# Patient Record
Sex: Male | Born: 1960 | Race: White | Hispanic: No | Marital: Married | Smoking: Former smoker
Health system: Southern US, Community
[De-identification: ages and names within clinical notes are randomized; demographics above are authoritative.]

## PROBLEM LIST (undated history)

## (undated) DIAGNOSIS — Z789 Other specified health status: Secondary | ICD-10-CM

## (undated) DIAGNOSIS — I471 Supraventricular tachycardia, unspecified: Secondary | ICD-10-CM

## (undated) HISTORY — DX: Supraventricular tachycardia, unspecified: I47.10

## (undated) HISTORY — DX: Supraventricular tachycardia: I47.1

## (undated) HISTORY — DX: Other specified health status: Z78.9

---

## 1991-06-13 HISTORY — PX: APPENDECTOMY: SHX54

## 2003-06-13 HISTORY — PX: HERNIA REPAIR: SHX51

## 2004-02-12 ENCOUNTER — Ambulatory Visit (HOSPITAL_COMMUNITY): Admission: RE | Admit: 2004-02-12 | Discharge: 2004-02-12 | Payer: Self-pay | Admitting: General Surgery

## 2004-02-12 ENCOUNTER — Emergency Department (HOSPITAL_COMMUNITY): Admission: EM | Admit: 2004-02-12 | Discharge: 2004-02-12 | Payer: Self-pay | Admitting: Emergency Medicine

## 2005-06-12 HISTORY — PX: VASECTOMY: SHX75

## 2008-07-02 ENCOUNTER — Encounter: Admission: RE | Admit: 2008-07-02 | Discharge: 2008-07-02 | Payer: Self-pay | Admitting: General Surgery

## 2009-12-02 IMAGING — CR DG CHEST 2V
2 series · 2 of 2 positions shown · non-contrast
Comparison: None

CLINICAL DATA: Preop prior to left inguinal hernia repair

CHEST - 2 VIEW

[w chest pa]
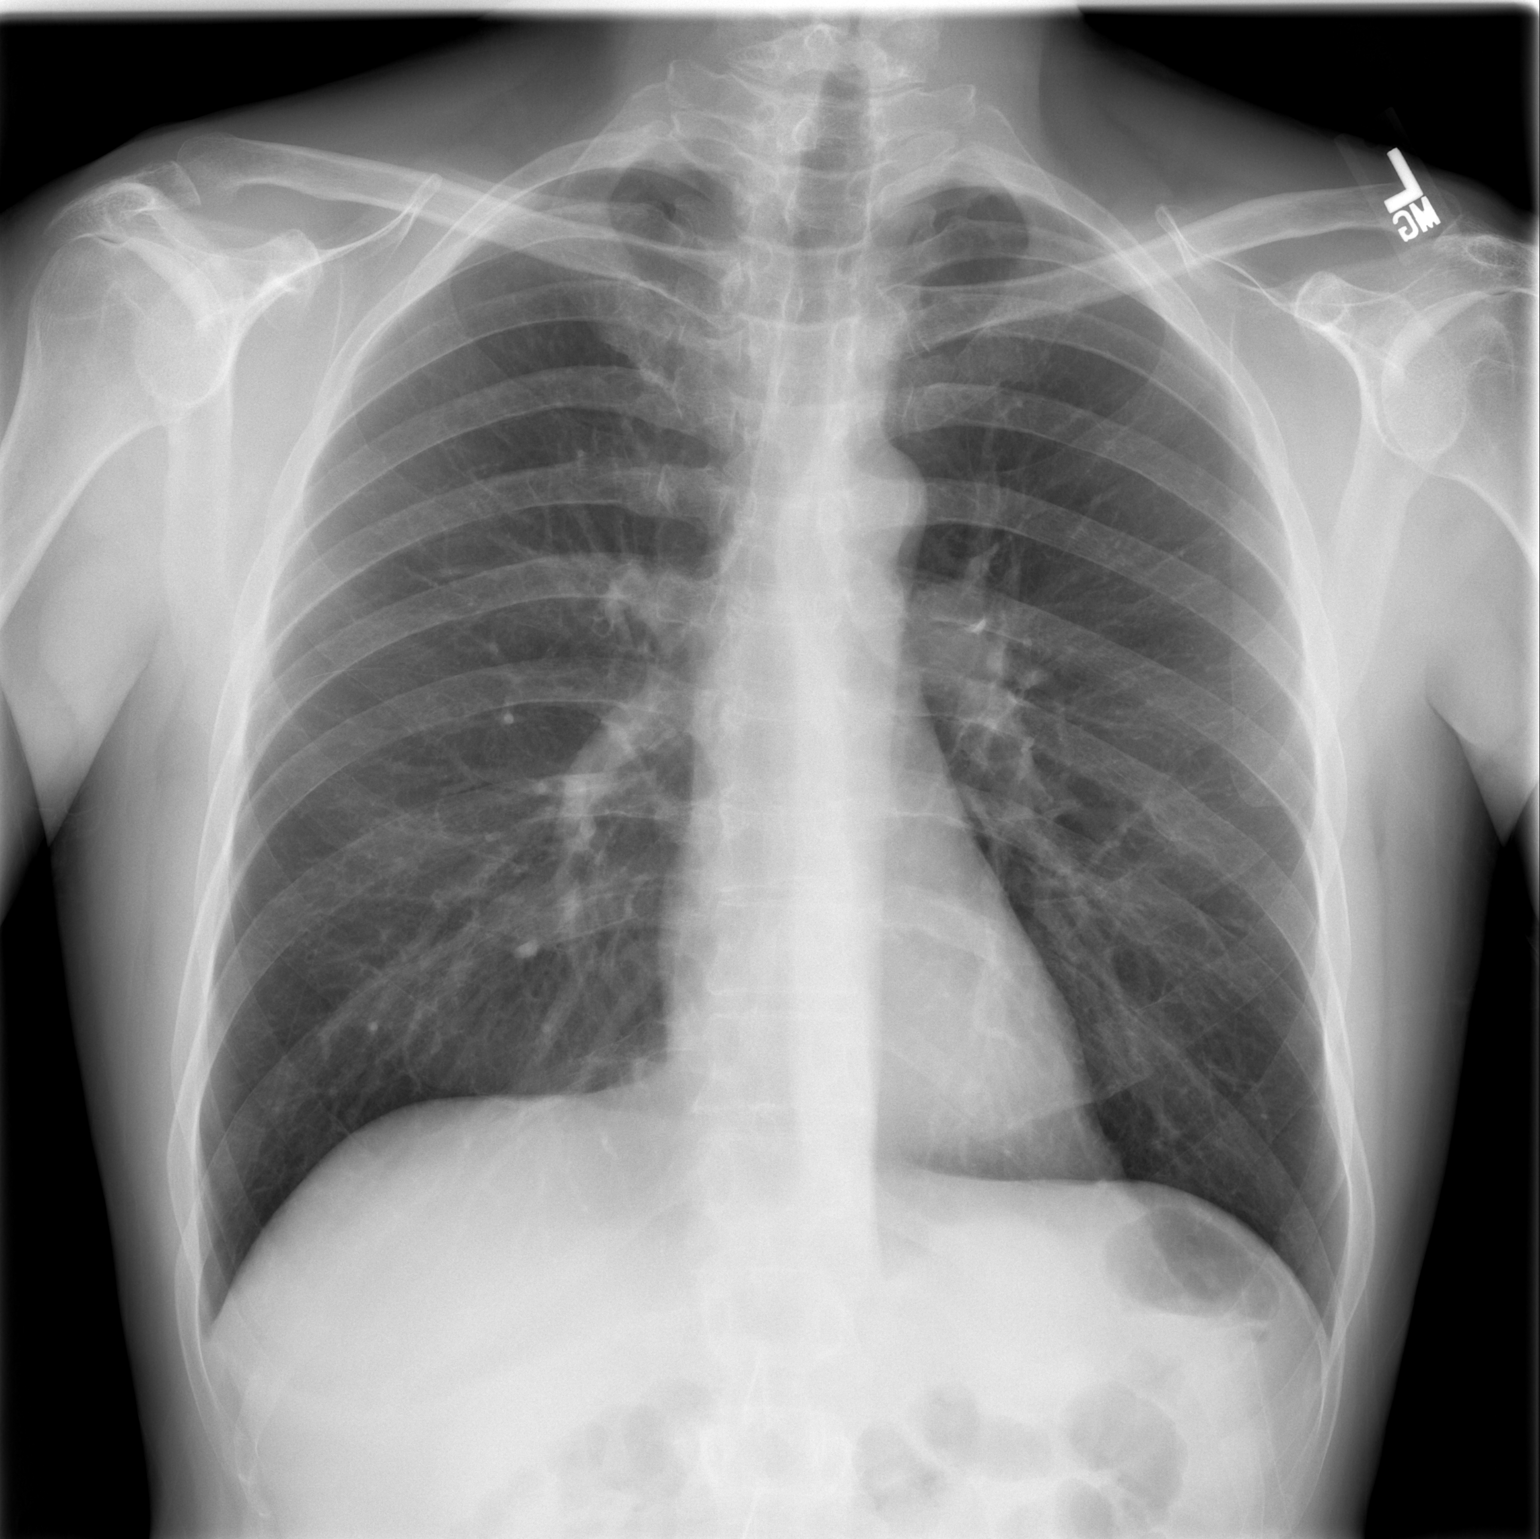

[w chest lat]
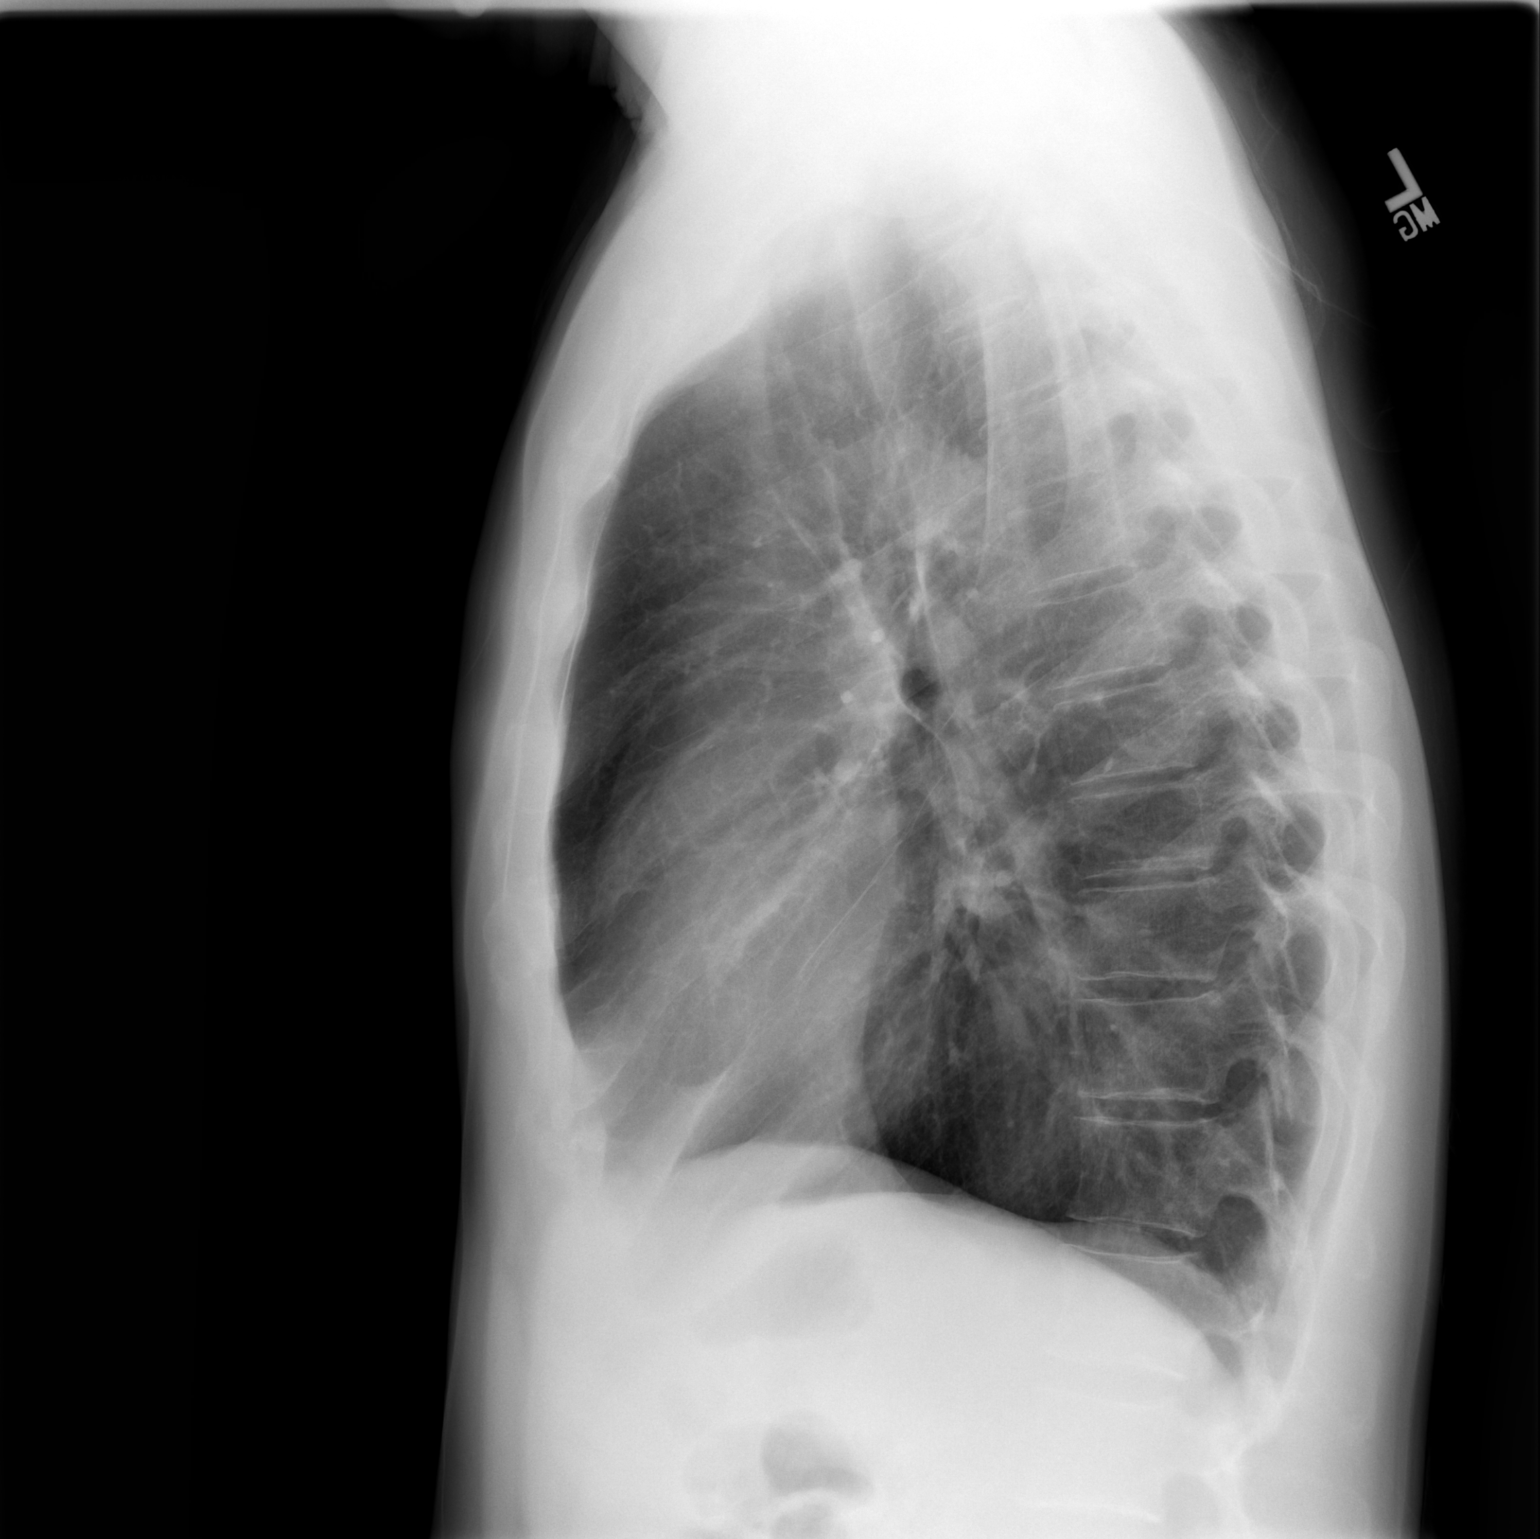

[2 of 2 positions shown; findings below may reference images not displayed]

FINDINGS: The lungs are clear. There is slight hyperaeration
present. The heart is within normal limits in size. No bony
abnormality is seen.
IMPRESSION: No active lung disease. Slight hyperaeration.

## 2010-10-28 NOTE — Op Note (Signed)
NAME:  ALEXIS, MIZUNO NO.:  [id number]   MEDICAL RECORD NO.:  [id number]                   PATIENT TYPE:  AMB   LOCATION:  DAY                                  FACILITY:  [facility]   PHYSICIAN:  Adolph Pollack, M.D.            DATE OF BIRTH:  04-Dec-1960   DATE OF PROCEDURE:  02/12/2004  DATE OF DISCHARGE:                                 OPERATIVE REPORT   PREOPERATIVE DIAGNOSIS:  Right inguinal hernia.   POSTOPERATIVE DIAGNOSIS:  Indirect right inguinal hernia.   PROCEDURE:  Laparoscopic repair of right inguinal hernia with mesh.   SURGEON:  Adolph Pollack, M.D.   ANESTHESIA:  General.   INDICATIONS:  Mr. Friday is a 50 year old male who felt a popping in the  right groin and then has noted a swelling.  When he strains, he says it  pops out.  He has to reduce it.  He has an obvious right inguinal hernia  on exam and has chosen a laparoscopic technique for repair.   TECHNIQUE:  He was seen in the holding area, then brought to the operating  room.  Placed supine on the operating room table.  A general anesthetic was  administered.  The abdominal wall hair was clipped.  The Foley catheter was  placed sterilely.  The abdominal wall was sterilely prepped and draped.   Dilute Marcaine solution was infiltrated at the subumbilical region, and a  subumbilical incision was made through the skin, and subcutaneous tissue to  the fascia was identified.  I then identified the right anterior rectus  sheath just off the midline and made a small incision in it.  The underlying  rectus muscle was then dissected laterally, exposing the posterior rectus  sheath.  A balloon dissection device was placed into the extraperitoneal  space, and balloon dissection was performed.  When adequate balloon  dissection was insured, the balloon was removed.  A trocar was placed in the  extraperitoneal space, and CO2 gas was used to insufflate the  extraperitoneal space.  The  laparoscope was reintroduced, and under direct  vision, two 5 mm trocars were placed just to the left of the lower midline.  Using blunt dissection, the symphysis pubis and Cooper's ligament were  exposed on the right side.  The direct space appeared solid.  I then used  blunt dissection to dissect the fibrofatty tissue away from the lateral and  anterior abdominal wall up to the level of the umbilicus on the right side.  First, spermatic cord was isolated using blunt dissection.  I then saw an  indirect hernia sac, which was fairly adherent to the spermatic cord and  with blunt dissection, I was able to reduce this and separate it from the  cord.  A small hole was made into the peritoneum, but I did not feel I  needed to close this.  I was able to strip  the sac back to the level of the  umbilicus.   A piece of 5x6 polypropylene mesh with a partial longitudinal slit cut into  it was then brought onto the field and placed appropriately in the right  extraperitoneal space with the two tails wrapped around the cord.  The mesh  was then anchored to Cooper's ligament at the anterior and lateral abdominal  walls with spiral tacks.  This provided for adequate coverage in direct,  indirect, and femoral spaces.  The two tails were crossed, creating a new  internal ring.   The area was examined, and hemostasis was adequate.  The inferior lateral  aspects of the mesh were then held down, and the CO2 gases were released.  I  watched the extraperitoneal contents approximate the mesh.  I then removed  all of the trocars.  The right anterior rectus sheath defect was closed with  interrupted 0 Vicryl sutures and  the skin incisions were closed with 4-0 Monocryl subcuticular stitches  followed by Steri-Strips and sterile dressings.  He tolerated the procedure  well without any apparent complications and was taken to the recovery room  in satisfactory condition.                                                Adolph Pollack, M.D.    Kari Baars  D:  02/12/2004  T:  02/12/2004  Job:  [id number]   cc:   Thora Lance, M.D.  [address]  [place]  [place] [address]  Fax: [phone]

## 2017-12-27 ENCOUNTER — Ambulatory Visit: Payer: Self-pay | Admitting: Family Medicine

## 2017-12-27 ENCOUNTER — Encounter: Payer: Self-pay | Admitting: Family Medicine

## 2017-12-28 ENCOUNTER — Ambulatory Visit (INDEPENDENT_AMBULATORY_CARE_PROVIDER_SITE_OTHER): Payer: 59 | Admitting: Family Medicine

## 2017-12-28 ENCOUNTER — Encounter: Payer: Self-pay | Admitting: Family Medicine

## 2017-12-28 VITALS — BP 120/74 | HR 68 | Temp 98.4°F | Ht 72.0 in | Wt 155.4 lb

## 2017-12-28 DIAGNOSIS — Z0000 Encounter for general adult medical examination without abnormal findings: Secondary | ICD-10-CM

## 2017-12-28 DIAGNOSIS — I8390 Asymptomatic varicose veins of unspecified lower extremity: Secondary | ICD-10-CM | POA: Diagnosis not present

## 2017-12-28 DIAGNOSIS — I499 Cardiac arrhythmia, unspecified: Secondary | ICD-10-CM | POA: Diagnosis not present

## 2017-12-28 DIAGNOSIS — R0982 Postnasal drip: Secondary | ICD-10-CM

## 2017-12-28 DIAGNOSIS — Z23 Encounter for immunization: Secondary | ICD-10-CM | POA: Diagnosis not present

## 2017-12-28 LAB — LIPID PANEL
CHOLESTEROL: 234 mg/dL — AB (ref 0–200)
HDL: 60 mg/dL (ref >39.00)
LDL Cholesterol: 158 mg/dL — ABNORMAL HIGH (ref 0–99)
NONHDL: 174.23
Total CHOL/HDL Ratio: 4
Triglycerides: 81 mg/dL (ref 0.0–149.0)
VLDL: 16.2 mg/dL (ref 0.0–40.0)

## 2017-12-28 LAB — COMPREHENSIVE METABOLIC PANEL
ALBUMIN: 4.3 g/dL (ref 3.5–5.2)
ALK PHOS: 68 U/L (ref 39–117)
ALT: 20 U/L (ref 0–53)
AST: 18 U/L (ref 0–37)
BILIRUBIN TOTAL: 0.6 mg/dL (ref 0.2–1.2)
BUN: 19 mg/dL (ref 6–23)
CO2: 30 mEq/L (ref 19–32)
CREATININE: 1.09 mg/dL (ref 0.40–1.50)
Calcium: 9.6 mg/dL (ref 8.4–10.5)
Chloride: 103 mEq/L (ref 96–112)
GFR: 73.62 mL/min (ref >60.00)
GLUCOSE: 109 mg/dL — AB (ref 70–99)
Potassium: 5 mEq/L (ref 3.5–5.1)
SODIUM: 138 meq/L (ref 135–145)
TOTAL PROTEIN: 7.2 g/dL (ref 6.0–8.3)

## 2017-12-28 NOTE — Patient Instructions (Addendum)
Give us 2-3 business days to get the results of your labs back. If labs are normal, you will likely receive a letter in the mail unless you have MyChart. This can take longer than 2-3 business days.   Stay active and keep the diet clean.  I don't think I would do anything with the shortness of breath/irregular pulse. If that changes, let me know.   Let me know if you would like to see a surgeon for your varicose veins.   Allegra (fexofenadine) or Zyrtec (cetirizine); Generic, and therefore cheaper, options are in the parentheses.   Flonase (fluticasone); nasal spray that is over the counter. 2 sprays each nostril, once daily. Aim towards the same side eye when you spray.  There are available OTC, and the generic versions, which may be cheaper, are in parentheses. Show this to a pharmacist if you have trouble finding any of these items.  Let us know if you need anything.

## 2017-12-28 NOTE — Progress Notes (Signed)
Pre visit review using our clinic review tool, if applicable. No additional management support is needed unless otherwise documented below in the visit note. 

## 2017-12-28 NOTE — Progress Notes (Signed)
Chief Complaint  Patient presents with  . New Patient (Initial Visit)    Well Male Darryl Curry is here for a complete physical.   His last physical was >1 year ago.  Current diet: in general, a "healthy" diet.  Current exercise: walking Weight trend: stable Does pt snore? No. Daytime fatigue? No. Seat belt? Yes.    Health maintenance Shingrix- No Colonoscopy- Yes - 6-7 years ago Tetanus- No HIV- Yes - 12/28/1988 Hep C- Yes - 12/28/1988 Prostate cancer screening- No   Patient has a history of postnasal drip that affects him most in the morning.  He is having some congestion.  He takes Zyrtec daily.  Denies any fevers, wheezing, or shortness of breath associated with these episodes.  He will have intermittent periods of shortness of breath.  He will feel his pulse and it will feel irregular.  It lasts around 30 seconds.  This happens around 3 times per year.  No chest pain.  He also has a history of varicose veins.  They are not painful.  He is wondering if there is anything that can be done for them.  He used to stand a lot while at work.  He believes this contributed to the development.   History reviewed. No pertinent past medical history.    Past Surgical History:  Procedure Laterality Date  . APPENDECTOMY  1993  . HERNIA REPAIR  2005   right 2005 and left 2008  . VASECTOMY  2007    Medications  Takes no meds routinely.   Allergies No Known Allergies  Family History Family History  Problem Relation Age of Onset  . Heart disease Mother   . Cancer Sister     Review of Systems: Constitutional:  no fevers Eye:  no recent significant change in vision Ear/Nose/Mouth/Throat:  Ears:  no hearing loss Nose/Mouth/Throat:  +nasal congestion, no sore throat Cardiovascular:  no chest pain Respiratory:  no cough and no current shortness of breath Gastrointestinal:  no abdominal pain, no change in bowel habits GU:  Male: negative for dysuria, frequency, and incontinence  and negative for prostate symptoms Musculoskeletal/Extremities:  no pain, redness, or swelling of the joints Integumentary (Skin/Breast): +varicose veins, otherwise no abnormal skin lesions reported Neurologic:  no headaches Endocrine: No unexpected weight changes Hematologic/Lymphatic:  no abnormal bleeding  Exam BP 120/74 (BP Location: Left Arm, Patient Position: Sitting, Cuff Size: Normal)   Pulse 68   Temp 98.4 F (36.9 C) (Oral)   Ht 6' (1.829 m)   Wt 155 lb 6 oz (70.5 kg)   SpO2 97%   BMI 21.07 kg/m  General:  well developed, well nourished, in no apparent distress Skin:  no significant moles, warts, or growths Head:  no masses, lesions, or tenderness Eyes:  pupils equal and round, sclera anicteric without injection Ears:  canals without lesions, TMs shiny without retraction, no obvious effusion, no erythema Nose:  nares patent, septum midline, mucosa normal Throat/Pharynx:  lips and gingiva without lesion; tongue and uvula midline; non-inflamed pharynx; no exudates or postnasal drainage Neck: neck supple without adenopathy, thyromegaly, or masses Lungs:  clear to auscultation, breath sounds equal bilaterally, no respiratory distress Cardio:  regular rate and rhythm, no LE edema, no bruits Abdomen:  abdomen soft, nontender; bowel sounds normal; no masses or organomegaly Genital (male): Nml penis, no lesions or discharge; testes present bilaterally without masses or tenderness Rectal: Deferred Musculoskeletal:  symmetrical muscle groups noted without atrophy or deformity Extremities: +varicose veins; otherwise no clubbing,  cyanosis, or edema, no deformities, no skin discoloration Neuro:  gait normal; deep tendon reflexes normal and symmetric Psych: well oriented with normal range of affect and appropriate judgment/insight  Assessment and Plan  Well adult exam - Plan: Lipid panel, Comprehensive metabolic panel  PND (post-nasal drip)  Irregular heartbeat  Asymptomatic  varicose veins of lower extremity, unspecified laterality  Need for tetanus booster - Plan: Tdap vaccine greater than or equal to 7yo IM   Well 57 y.o. male. Counseled on diet and exercise. Counseled on risks and benefits of prostate cancer screening with PSA. The patient agrees to forego testing. Recommended adding an intranasal corticosteroid.  Could continue Zyrtec on a daily basis or change to Allegra.  If no improvement, could consider montelukast versus referral. Approximately 90 seconds of an irregular heartbeat yearly is not significant enough to treat even if it were atrial fibrillation.  We will monitor and if anything changes, he will let us know. I did offer referral to a vascular surgeon, however he would prefer to wait at this time given his issues are not symptomatic. Immunizations, labs, and further orders as above. Follow up 1 yr or prn. The patient voiced understanding and agreement to the plan.  Jilda Rocheicholas Paul PalmyraWendling, DO 12/28/17 12:09 PM

## 2017-12-31 ENCOUNTER — Other Ambulatory Visit: Payer: Self-pay | Admitting: Family Medicine

## 2017-12-31 DIAGNOSIS — E782 Mixed hyperlipidemia: Secondary | ICD-10-CM

## 2018-01-03 ENCOUNTER — Encounter: Payer: Self-pay | Admitting: Podiatry

## 2018-01-03 ENCOUNTER — Ambulatory Visit (INDEPENDENT_AMBULATORY_CARE_PROVIDER_SITE_OTHER): Payer: 59 | Admitting: Podiatry

## 2018-01-03 DIAGNOSIS — B070 Plantar wart: Secondary | ICD-10-CM | POA: Diagnosis not present

## 2018-01-03 NOTE — Progress Notes (Signed)
  Subjective:  Patient ID: Margurite Auerbacholan I Husband, male    DOB: Nov 13, 1960,  MRN: [id number] HPI Chief Complaint  Patient presents with  . Plantar Warts    Patient presents today for plantar wart bottom of right forefoot x 4 months.  He reports it was sore, but now feels better.  He used otc Dr. Jari SportsmanScholls wart remover which didn't help.    57 y.o. male presents with the above complaint.   Denies fever chills nausea vomiting muscle aches pains calf pain back pain chest pain shortness of breath.  Past Medical History:  Diagnosis Date  . No known health problems    Past Surgical History:  Procedure Laterality Date  . APPENDECTOMY  1993  . HERNIA REPAIR  2005   right 2005 and left 2008  . VASECTOMY  2007    Current Outpatient Medications:  .  cetirizine (ZYRTEC) 10 MG tablet, Take 10 mg by mouth daily., Disp: , Rfl:   No Known Allergies Review of Systems Objective:  There were no vitals filed for this visit.  General: Well developed, nourished, in no acute distress, alert and oriented x3   Dermatological: Skin is warm, dry and supple bilateral. Nails x 10 are well maintained; remaining integument appears unremarkable at this time. There are no open sores, no preulcerative lesions, no rash or signs of infection present.  Small what appears to be verrucoid lesion to the plantar aspect of the forefoot right.  Was debrided demonstrates no lesion whatsoever.  Vascular: Dorsalis Pedis artery and Posterior Tibial artery pedal pulses are 2/4 bilateral with immedate capillary fill time. Pedal hair growth present. No varicosities and no lower extremity edema present bilateral.   Neruologic: Grossly intact via light touch bilateral. Vibratory intact via tuning fork bilateral. Protective threshold with Semmes Wienstein monofilament intact to all pedal sites bilateral. Patellar and Achilles deep tendon reflexes 2+ bilateral. No Babinski or clonus noted bilateral.   Musculoskeletal: No gross boney pedal  deformities bilateral. No pain, crepitus, or limitation noted with foot and ankle range of motion bilateral. Muscular strength 5/5 in all groups tested bilateral.  Gait: Unassisted, Nonantalgic.    Radiographs:  None taken  Assessment & Plan:   Assessment: Resolution of wart  Plan: Debrided the area today follow-up with me with any recurrence.     Max T. CarolinaHyatt, North DakotaDPM

## 2018-01-14 ENCOUNTER — Emergency Department (HOSPITAL_BASED_OUTPATIENT_CLINIC_OR_DEPARTMENT_OTHER): Payer: 59

## 2018-01-14 ENCOUNTER — Encounter (HOSPITAL_BASED_OUTPATIENT_CLINIC_OR_DEPARTMENT_OTHER): Payer: Self-pay | Admitting: *Deleted

## 2018-01-14 ENCOUNTER — Other Ambulatory Visit: Payer: Self-pay

## 2018-01-14 ENCOUNTER — Ambulatory Visit: Payer: 59

## 2018-01-14 ENCOUNTER — Emergency Department (HOSPITAL_BASED_OUTPATIENT_CLINIC_OR_DEPARTMENT_OTHER)
Admission: EM | Admit: 2018-01-14 | Discharge: 2018-01-14 | Disposition: A | Discharge status: Home / Self Care | Payer: 59 | Attending: Emergency Medicine | Admitting: Emergency Medicine

## 2018-01-14 VITALS — BP 118/90 | HR 102 | Temp 98.0°F | Resp 18

## 2018-01-14 DIAGNOSIS — I499 Cardiac arrhythmia, unspecified: Secondary | ICD-10-CM

## 2018-01-14 DIAGNOSIS — Z87891 Personal history of nicotine dependence: Secondary | ICD-10-CM | POA: Insufficient documentation

## 2018-01-14 DIAGNOSIS — R002 Palpitations: Secondary | ICD-10-CM | POA: Insufficient documentation

## 2018-01-14 DIAGNOSIS — R0602 Shortness of breath: Secondary | ICD-10-CM | POA: Diagnosis not present

## 2018-01-14 LAB — BASIC METABOLIC PANEL
Anion gap: 8 (ref 5–15)
BUN: 10 mg/dL (ref 6–20)
CHLORIDE: 103 mmol/L (ref 98–111)
CO2: 27 mmol/L (ref 22–32)
Calcium: 9.2 mg/dL (ref 8.9–10.3)
Creatinine, Ser: 1.04 mg/dL (ref 0.61–1.24)
GFR calc Af Amer: >60 mL/min (ref >60)
GLUCOSE: 141 mg/dL — AB (ref 70–99)
POTASSIUM: 4.2 mmol/L (ref 3.5–5.1)
Sodium: 138 mmol/L (ref 135–145)

## 2018-01-14 LAB — CBC
HEMATOCRIT: 41.3 % (ref 39.0–52.0)
Hemoglobin: 14.6 g/dL (ref 13.0–17.0)
MCH: 32.8 pg (ref 26.0–34.0)
MCHC: 35.4 g/dL (ref 30.0–36.0)
MCV: 92.8 fL (ref 78.0–100.0)
Platelets: 240 10*3/uL (ref 150–400)
RBC: 4.45 MIL/uL (ref 4.22–5.81)
RDW: 11.9 % (ref 11.5–15.5)
WBC: 9.6 10*3/uL (ref 4.0–10.5)

## 2018-01-14 LAB — TROPONIN I: Troponin I: <0.03 ng/mL (ref <0.03)

## 2018-01-14 NOTE — ED Notes (Signed)
Pt verbalizes understanding of d/c instructions and denies any further need at this time. 

## 2018-01-14 NOTE — ED Provider Notes (Addendum)
[facility] EMERGENCY DEPARTMENT Provider Note   CSN: [id number] Arrival date & time: 01/14/18  1642     History   Chief Complaint Chief Complaint  Patient presents with  . Palpitations    HPI Darryl Curry is a 57 y.o. male presenting today for heart palpitations.  Patient states that at 4 PM today he began having palpitations, feeling like his heart was skipping a beat followed by some shortness of breath.  Patient states that this continued for approximately 45 minutes and stopped when he got to his primary care provider.  Patient states that his primary care provider then sent him here to the emergency department.  Patient denying palpitations or shortness of breath upon arrival to the ED. Patient states that he has these palpitations every 4-6 months and they last normally 5 minutes.  He states that he became concerned because the episode of palpitations lasted longer today.  Patient states that he is otherwise healthy, denies history of heart disease, clots, dysrhythmias.  Patient denies chest pain, nausea vomiting, diarrhea, abdominal pain, headache, syncope or vision changes.  HPI  Past Medical History:  Diagnosis Date  . No known health problems     There are no active problems to display for this patient.   Past Surgical History:  Procedure Laterality Date  . APPENDECTOMY  1993  . HERNIA REPAIR  2005   right 2005 and left 2008  . VASECTOMY  2007        Home Medications    Prior to Admission medications   Medication Sig Start Date End Date Taking? Authorizing Provider  cetirizine (ZYRTEC) 10 MG tablet Take 10 mg by mouth daily.    [provider]  Ibuprofen 200 MG CAPS Take by mouth.    [provider]    Family History Family History  Problem Relation Age of Onset  . Heart disease Mother   . Heart attack Mother   . Cancer Sister     Social History Social History   Tobacco Use  . Smoking status: Former Games developer  .  Smokeless tobacco: Never Used  Substance Use Topics  . Alcohol use: Yes    Frequency: Never  . Drug use: Never     Allergies   Patient has no known allergies.   Review of Systems Review of Systems  Constitutional: Negative.  Negative for chills and fever.  HENT: Negative.  Negative for rhinorrhea and sore throat.   Eyes: Negative.  Negative for visual disturbance.  Respiratory: Positive for shortness of breath. Negative for cough.   Cardiovascular: Positive for palpitations. Negative for chest pain and leg swelling.  Gastrointestinal: Negative.  Negative for abdominal pain, blood in stool, diarrhea, nausea and vomiting.  Genitourinary: Negative.  Negative for dysuria and hematuria.  Musculoskeletal: Negative.  Negative for arthralgias and myalgias.  Skin: Negative.  Negative for rash.  Neurological: Negative.  Negative for dizziness, syncope, weakness, light-headedness, numbness and headaches.     Physical Exam Updated Vital Signs BP (!) 142/99   Pulse 86   Resp 17   Ht 6' (1.829 m)   Wt 70.3 kg (155 lb)   SpO2 99%   BMI 21.02 kg/m   Physical Exam  Constitutional: He is oriented to person, place, and time. He appears well-developed and well-nourished. No distress.  HENT:  Head: Normocephalic and atraumatic.  Right Ear: External ear normal.  Left Ear: External ear normal.  Nose: Nose normal.  Eyes: Pupils are equal, round, and reactive  to light. EOM are normal.  Neck: Trachea normal and normal range of motion. No tracheal deviation present.  Cardiovascular: Normal rate, regular rhythm, normal heart sounds and intact distal pulses.  No murmur heard. Pulses:      Dorsalis pedis pulses are 3+ on the right side, and 3+ on the left side.       Posterior tibial pulses are 3+ on the right side, and 3+ on the left side.  Pulmonary/Chest: Effort normal and breath sounds normal. No respiratory distress. He has no wheezes.  Abdominal: Soft. There is no tenderness. There is no  rebound and no guarding.  Musculoskeletal: Normal range of motion.       Right ankle: Normal.       Left ankle: Normal.       Right lower leg: Normal.       Left lower leg: Normal.       Right foot: Normal.       Left foot: Normal.  Neurological: He is alert and oriented to person, place, and time.  Skin: Skin is warm and dry. Capillary refill takes less than 2 seconds.  Psychiatric: He has a normal mood and affect. His behavior is normal.    ED Treatments / Results  Labs (all labs ordered are listed, but only abnormal results are displayed) Labs Reviewed  BASIC METABOLIC PANEL - Abnormal; Notable for the following components:      Result Value   Glucose, Bld 141 (*)    All other components within normal limits  CBC  TROPONIN I    EKG EKG Interpretation  Date/Time:  Monday January 14 2018 16:54:04 EDT Ventricular Rate:  82 PR Interval:    QRS Duration: 99 QT Interval:  367 QTC Calculation: 429 R Axis:   81 Text Interpretation:  Normal sinus rhythm no acute ST/T changes No old tracing to compare Confirmed by Pricilla Loveless [phone]) on 01/14/2018 5:11:15 PM   Radiology Dg Chest 2 View  Result Date: 01/14/2018 CLINICAL DATA:  Palpitation and shortness of breath for 1 hour EXAM: CHEST - 2 VIEW COMPARISON:  07/02/2008 FINDINGS: Artifact from EKG leads. There is no edema, consolidation, effusion, or pneumothorax. Normal heart size and mediastinal contours. IMPRESSION: Negative chest. Electronically Signed   By: Marnee Spring M.D.   On: 01/14/2018 17:09    Procedures Procedures (including critical care time)  Medications Ordered in ED Medications - No data to display   Initial Impression / Assessment and Plan / ED Course  I have reviewed the triage vital signs and the nursing notes.  Pertinent labs & imaging results that were available during my care of the patient were reviewed by me and considered in my medical decision making (see chart for details).    Patient  presenting for palpitations that lasted approximately 45 minutes today.  Patient with history of similar palpitations in the past however he states that they have never lasted this long before.  EKG shows no acute abnormality at this time, chest x-ray negative, troponin negative, CBC and BMP nonacute.  Patient was placed on cardiac monitor here in the emergency department and had no recurrence of his palpitations today.  Patient denying palpitations, shortness of breath or chest pain in the emergency department.  Patient has reliable follow-up with his primary care provider.  Patient offered referral to cardiology however he refused stating he would rather follow-up with his primary care provider and then possibly follow-up with cardiologist in the future.  Patient was also seen  and evaluated by Dr. Hedwig MortonGoldson who agrees that patient is safe for discharge with PCP follow-up at this time.  At this time there does not appear to be any evidence of an acute emergency medical condition and the patient appears stable for discharge with appropriate outpatient follow up. Diagnosis was discussed with patient who verbalizes understanding of care plan and is agreeable to discharge. I have discussed return precautions with patient who verbalizes understanding of return precautions. Patient strongly encouraged to follow-up with their PCP. All questions answered.  Patient's case discussed with Dr. Criss AlvineGoldston who agrees with plan to discharge with follow-up.     Note: Portions of this report may have been transcribed using voice recognition software. Every effort was made to ensure accuracy; however, inadvertent computerized transcription errors may still be present.   Final Clinical Impressions(s) / ED Diagnoses   Final diagnoses:  Palpitations    ED Discharge Orders    None       Bill SalinasMorelli, Chloe Flis A, PA-C 01/14/18 1850    Elizabeth PalauMorelli, Shakenya Stoneberg A, PA-C 01/14/18 1851    Pricilla LovelessGoldston, Scott, MD 01/15/18 1450

## 2018-01-14 NOTE — ED Notes (Signed)
Pt c/o intermittent palpitations for the last several years, today they lasted about 20 minutes accompanied by SOB.  Pt denies current SOB, denies current palpitations, denies chest pain.

## 2018-01-14 NOTE — Progress Notes (Signed)
Pt walked into clinic, c/o heart palpitations, SOB X . Pt. Denies any known trigger/change in activity/diet. During triage, pt. stated sx had resolved, VSS. Mother has cardiac history, but no known medical hx on pt. Medications and allergies reviewed. Pt. Was unable to be seen by provider today,so author sent to ED to be further evaluated before his trip out of town. Report to be given to charge RN.

## 2018-01-14 NOTE — ED Triage Notes (Signed)
Pt sent here by PMD for eval , palpitations x 45 mins

## 2018-01-14 NOTE — Discharge Instructions (Addendum)
Please follow-up with your primary care provider as soon as you can regarding your visit today.  It is likely that they will set you up to follow-up with a cardiologist in the future for further evaluation. Please return to the emergency department for any new or worsening symptoms or if your palpitations return. Your blood pressure was also elevated today, please be sure to follow-up with your primary care provider regarding this as well.  Contact a health care provider if: You continue to have a fast or irregular heartbeat after 24 hours. Your palpitations occur more often. Get help right away if: You have chest pain or shortness of breath. You have a severe headache. You feel dizzy or you faint.

## 2018-01-18 ENCOUNTER — Ambulatory Visit (INDEPENDENT_AMBULATORY_CARE_PROVIDER_SITE_OTHER): Payer: 59 | Admitting: Family Medicine

## 2018-01-18 ENCOUNTER — Encounter: Payer: Self-pay | Admitting: Family Medicine

## 2018-01-18 VITALS — BP 108/80 | HR 75 | Temp 98.2°F | Ht 72.0 in | Wt 156.0 lb

## 2018-01-18 DIAGNOSIS — G5601 Carpal tunnel syndrome, right upper limb: Secondary | ICD-10-CM

## 2018-01-18 DIAGNOSIS — R002 Palpitations: Secondary | ICD-10-CM

## 2018-01-18 DIAGNOSIS — S46811A Strain of other muscles, fascia and tendons at shoulder and upper arm level, right arm, initial encounter: Secondary | ICD-10-CM | POA: Diagnosis not present

## 2018-01-18 HISTORY — DX: Palpitations: R00.2

## 2018-01-18 HISTORY — DX: Carpal tunnel syndrome, right upper limb: G56.01

## 2018-01-18 NOTE — Progress Notes (Signed)
Pre visit review using our clinic review tool, if applicable. No additional management support is needed unless otherwise documented below in the visit note. 

## 2018-01-18 NOTE — Progress Notes (Signed)
Chief Complaint  Patient presents with  . Follow-up    ER--heart palpitations    Darryl Curry is a 57 y.o. male here for palpitations.  Length of issue: chronic, 1.5 weeks ago How long does it last: 5-45 minutes Light headedness/passing out? No Shortness of breath? Yes  CP? No Hx of arrhythmia? No Medication changes/illicit substances? No   He has been having right shoulder pain over the past several weeks.  It is steadily improving.  He is wondering if is related to numbness/tingling in his right hand.  That seems constant.  He is right-handed.  There is no injury or change in activity.  He has been using anti-inflammatories at home with some relief.  He is not dropping things.  Denies weakness otherwise.  ROS: Cardiac: As noted in HPI Pulm: No SOB  Past Medical History:  Diagnosis Date  . No known health problems    Past Surgical History:  Procedure Laterality Date  . APPENDECTOMY  1993  . HERNIA REPAIR  2005   right 2005 and left 2008  . VASECTOMY  2007   No Known Allergies Allergies as of 01/18/2018   No Known Allergies     Medication List        Accurate as of 01/18/18  3:36 PM. Always use your most recent med list.          cetirizine 10 MG tablet Commonly known as:  ZYRTEC Take 10 mg by mouth daily.   Ibuprofen 200 MG Caps Take by mouth.       BP 108/80 (BP Location: Left Arm, Patient Position: Sitting, Cuff Size: Normal)   Pulse 75   Temp 98.2 F (36.8 C) (Oral)   Ht 6' (1.829 m)   Wt 156 lb (70.8 kg)   SpO2 95%   BMI 21.16 kg/m  Gen: Awake, alert, appearing stated age Eyes: PERRLA Mouth: MMM Heart: RRR, no bruits, no LE edema Lungs: CTAB, no accessory muscle use Neuro: No cerebellar signs; + Phalen's, negative Tinel's on the right MSK: No muscle group atrophy or asymmetry, 5/5 strength throughout in the upper extremities bilaterally, mild tenderness to palpation over the trapezius on the right Psych: Age appropriate judgment and insight,  mood/affect WNL  Palpitations - Plan: Ambulatory referral to Cardiology, TSH, CANCELED: TSH  Strain of right trapezius muscle, initial encounter  Carpal tunnel syndrome of right wrist   Check thyroid, refer to cardiology.  I would be interested in getting a Holter monitor, however due to the erratic frequency of his attacks, I would rather him see cardiology for a prolonged Holter rather than ordering a 2-day Holter and missing it. Stretches, anti-inflammatory's, heat, ice, Tylenol, activity as tolerated. Stretch, cock-up splint.  Follow-up in 4 weeks if no improvement, will consider injections versus referral to Occupational Therapy. The patient voiced understanding and agreement to the plan.  Darryl Curry 3:36 PM 01/18/18

## 2018-01-18 NOTE — Patient Instructions (Addendum)
Ice/cold pack over area for 10-15 min twice daily.  Heat (pad or rice pillow in microwave) over affected area, 10-15 minutes twice daily.   Wear the brace at night and when you are using your wrist. If you are not turning the corner in the next 4-5 weeks, I want to see you again.   If you do not hear anything about your referral in the next 1-2 weeks, call our office and ask for an update.  Trapezius stretches/exercises Do exercises exactly as told by your health care provider and adjust them as directed. It is normal to feel mild stretching, pulling, tightness, or discomfort as you do these exercises, but you should stop right away if you feel sudden pain or your pain gets worse.  Stretching and range of motion exercises These exercises warm up your muscles and joints and improve the movement and flexibility of your shoulder. These exercises can also help to relieve pain, numbness, and tingling. If you are unable to do any of the following for any reason, do not further attempt to do it.   Exercise A: Flexion, standing    1. Stand and hold a broomstick, a cane, or a similar object. Place your hands a little more than shoulder-width apart on the object. Your left / right hand should be palm-up, and your other hand should be palm-down. 2. Push the stick to raise your left / right arm out to your side and then over your head. Use your other hand to help move the stick. Stop when you feel a stretch in your shoulder, or when you reach the angle that is recommended by your health care provider. ? Avoid shrugging your shoulder while you raise your arm. Keep your shoulder blade tucked down toward your spine. 3. Hold for 30 seconds. 4. Slowly return to the starting position. Repeat 2 times. Complete this exercise 3 times per week.  Exercise B: Abduction, supine    1. Lie on your back and hold a broomstick, a cane, or a similar object. Place your hands a little more than shoulder-width apart on  the object. Your left / right hand should be palm-up, and your other hand should be palm-down. 2. Push the stick to raise your left / right arm out to your side and then over your head. Use your other hand to help move the stick. Stop when you feel a stretch in your shoulder, or when you reach the angle that is recommended by your health care provider. ? Avoid shrugging your shoulder while you raise your arm. Keep your shoulder blade tucked down toward your spine. 3. Hold for 30 seconds. 4. Slowly return to the starting position. Repeat 2 times. Complete this exercise 3 times per week.  Exercise C: Flexion, active-assisted    1. Lie on your back. You may bend your knees for comfort. 2. Hold a broomstick, a cane, or a similar object. Place your hands about shoulder-width apart on the object. Your palms should face toward your feet. 3. Raise the stick and move your arms over your head and behind your head, toward the floor. Use your healthy arm to help your left / right arm move farther. Stop when you feel a gentle stretch in your shoulder, or when you reach the angle where your health care provider tells you to stop. 4. Hold for 30 seconds. 5. Slowly return to the starting position. Repeat 2 times. Complete this exercise 3 times per week.  Exercise D: External rotation and  abduction    1. Stand in a door frame with one of your feet slightly in front of the other. This is called a staggered stance. 2. Choose one of the following positions as told by your health care provider: ? Place your hands and forearms on the door frame above your head. ? Place your hands and forearms on the door frame at the height of your head. ? Place your hands on the door frame at the height of your elbows. 3. Slowly move your weight onto your front foot until you feel a stretch across your chest and in the front of your shoulders. Keep your head and chest upright and keep your abdominal muscles tight. 4. Hold for  30 seconds. 5. To release the stretch, shift your weight to your back foot. Repeat 2 times. Complete this stretch 3 times per week.  Strengthening exercises These exercises build strength and endurance in your shoulder. Endurance is the ability to use your muscles for a long time, even after your muscles get tired. Exercise E: Scapular depression and adduction  1. Sit on a stable chair. Support your arms in front of you with pillows, armrests, or a tabletop. Keep your elbows in line with the sides of your body. 2. Gently move your shoulder blades down toward your middle back. Relax the muscles on the tops of your shoulders and in the back of your neck. 3. Hold for 3 seconds. 4. Slowly release the tension and relax your muscles completely before doing this exercise again. Repeat for a total of 10 repetitions. 5. After you have practiced this exercise, try doing the exercise without the arm support. Then, try the exercise while standing instead of sitting. Repeat 2 times. Complete this exercise 3 times per week.  Exercise F: Shoulder abduction, isometric    1. Stand or sit about 4-6 inches (10-15 cm) from a wall with your left / right side facing the wall. 2. Bend your left / right elbow and gently press your elbow against the wall. 3. Increase the pressure slowly until you are pressing as hard as you can without shrugging your shoulder. 4. Hold for 3 seconds. 5. Slowly release the tension and relax your muscles completely. Repeat for a total of 10 repetitions. Repeat 2 times. Complete this exercise 3 times per week.  Exercise G: Shoulder flexion, isometric    1. Stand or sit about 4-6 inches (10-15 cm) away from a wall with your left / right side facing the wall. 2. Keep your left / right elbow straight and gently press the top of your fist against the wall. Increase the pressure slowly until you are pressing as hard as you can without shrugging your shoulder. 3. Hold for 10-15  seconds. 4. Slowly release the tension and relax your muscles completely. Repeat for a total of 10 repetitions. Repeat 2 times. Complete this exercise 3 times per week.  Exercise H: Internal rotation    1. Sit in a stable chair without armrests, or stand. Secure an exercise band at your left / right side, at elbow height. 2. Place a soft object, such as a folded towel or a small pillow, under your left / right upper arm so your elbow is a few inches (about 8 cm) away from your side. 3. Hold the end of the exercise band so the band stretches. 4. Keeping your elbow pressed against the soft object under your arm, move your forearm across your body toward your abdomen. Keep your body  steady so the movement is only coming from your shoulder. 5. Hold for 3 seconds. 6. Slowly return to the starting position. Repeat for a total of 10 repetitions. Repeat 2 times. Complete this exercise 3 times per week.  Exercise I: External rotation    1. Sit in a stable chair without armrests, or stand. 2. Secure an exercise band at your left / right side, at elbow height. 3. Place a soft object, such as a folded towel or a small pillow, under your left / right upper arm so your elbow is a few inches (about 8 cm) away from your side. 4. Hold the end of the exercise band so the band stretches. 5. Keeping your elbow pressed against the soft object under your arm, move your forearm out, away from your abdomen. Keep your body steady so the movement is only coming from your shoulder. 6. Hold for 3 seconds. 7. Slowly return to the starting position. Repeat for a total of 10 repetitions. Repeat 2 times. Complete this exercise 3 times per week. Exercise J: Shoulder extension  1. Sit in a stable chair without armrests, or stand. Secure an exercise band to a stable object in front of you so the band is at shoulder height. 2. Hold one end of the exercise band in each hand. Your palms should face each  other. 3. Straighten your elbows and lift your hands up to shoulder height. 4. Step back, away from the secured end of the exercise band, until the band stretches. 5. Squeeze your shoulder blades together and pull your hands down to the sides of your thighs. Stop when your hands are straight down by your sides. Do not let your hands go behind your body. 6. Hold for 3 seconds. 7. Slowly return to the starting position. Repeat for a total of 10 repetitions. Repeat 2 times. Complete this exercise 3 times per week.  Exercise K: Shoulder extension, prone    1. Lie on your abdomen on a firm surface so your left / right arm hangs over the edge. 2. Hold a 5 lb weight in your hand so your palm faces in toward your body. Your arm should be straight. 3. Squeeze your shoulder blade down toward the middle of your back. 4. Slowly raise your arm behind you, up to the height of the surface that you are lying on. Keep your arm straight. 5. Hold for 3 seconds. 6. Slowly return to the starting position and relax your muscles. Repeat for a total of 10 repetitions. Repeat 2 times. Complete this exercise 3 times per week.   Exercise L: Horizontal abduction, prone  1. Lie on your abdomen on a firm surface so your left / right arm hangs over the edge. 2. Hold a 5 lb weight in your hand so your palm faces toward your feet. Your arm should be straight. 3. Squeeze your shoulder blade down toward the middle of your back. 4. Bend your elbow so your hand moves up, until your elbow is bent to an "L" shape (90 degrees). With your elbow bent, slowly move your forearm forward and up. Raise your hand up to the height of the surface that you are lying on. ? Your upper arm should not move, and your elbow should stay bent. ? At the top of the movement, your palm should face the floor. 5. Hold for 3 seconds. 6. Slowly return to the starting position and relax your muscles. Repeat for a total of 10 repetitions. Repeat 2  times.  Complete this exercise 3 times per week.  Exercise M: Horizontal abduction, standing  1. Sit on a stable chair, or stand. 2. Secure an exercise band to a stable object in front of you so the band is at shoulder height. 3. Hold one end of the exercise band in each hand. 4. Straighten your elbows and lift your hands straight in front of you, up to shoulder height. Your palms should face down, toward the floor. 5. Step back, away from the secured end of the exercise band, until the band stretches. 6. Move your arms out to your sides, and keep your arms straight. 7. Hold for 3 seconds. 8. Slowly return to the starting position. Repeat for a total of 10 repetitions. Repeat 2 times. Complete this exercise 3 times per week.  Exercise N: Scapular retraction and elevation  1. Sit on a stable chair, or stand. 2. Secure an exercise band to a stable object in front of you so the band is at shoulder height. 3. Hold one end of the exercise band in each hand. Your palms should face each other. 4. Sit in a stable chair without armrests, or stand. 5. Step back, away from the secured end of the exercise band, until the band stretches. 6. Squeeze your shoulder blades together and lift your hands over your head. Keep your elbows straight. 7. Hold for 3 seconds. 8. Slowly return to the starting position. Repeat for a total of 10 repetitions. Repeat 2 times. Complete this exercise 3 times per week.  This information is not intended to replace advice given to you by your health care provider. Make sure you discuss any questions you have with your health care provider. Document Released: 05/29/2005 Document Revised: 02/03/2016 Document Reviewed: 04/15/2015 Elsevier Interactive Patient Education  2017 [employer].

## 2018-01-19 LAB — TSH: TSH: 2.67 m[IU]/L (ref 0.40–4.50)

## 2018-03-20 ENCOUNTER — Ambulatory Visit: Payer: 59 | Admitting: Cardiology

## 2018-04-02 ENCOUNTER — Other Ambulatory Visit: Payer: Self-pay | Admitting: Family Medicine

## 2018-04-02 ENCOUNTER — Other Ambulatory Visit (INDEPENDENT_AMBULATORY_CARE_PROVIDER_SITE_OTHER): Payer: 59

## 2018-04-02 DIAGNOSIS — E782 Mixed hyperlipidemia: Secondary | ICD-10-CM

## 2018-04-02 DIAGNOSIS — E7800 Pure hypercholesterolemia, unspecified: Secondary | ICD-10-CM

## 2018-04-02 LAB — LIPID PANEL
CHOL/HDL RATIO: 4
CHOLESTEROL: 253 mg/dL — AB (ref 0–200)
HDL: 68.4 mg/dL (ref >39.00)
LDL Cholesterol: 169 mg/dL — ABNORMAL HIGH (ref 0–99)
NONHDL: 184.61
Triglycerides: 80 mg/dL (ref 0.0–149.0)
VLDL: 16 mg/dL (ref 0.0–40.0)

## 2018-04-03 ENCOUNTER — Encounter: Payer: Self-pay | Admitting: Cardiology

## 2018-04-03 ENCOUNTER — Ambulatory Visit (INDEPENDENT_AMBULATORY_CARE_PROVIDER_SITE_OTHER): Payer: 59 | Admitting: Cardiology

## 2018-04-03 VITALS — BP 114/80 | HR 69 | Ht 72.0 in | Wt 152.4 lb

## 2018-04-03 DIAGNOSIS — E785 Hyperlipidemia, unspecified: Secondary | ICD-10-CM

## 2018-04-03 DIAGNOSIS — R002 Palpitations: Secondary | ICD-10-CM | POA: Diagnosis not present

## 2018-04-03 DIAGNOSIS — R0609 Other forms of dyspnea: Secondary | ICD-10-CM

## 2018-04-03 DIAGNOSIS — R0600 Dyspnea, unspecified: Secondary | ICD-10-CM

## 2018-04-03 HISTORY — DX: Dyspnea, unspecified: R06.00

## 2018-04-03 HISTORY — DX: Other forms of dyspnea: R06.09

## 2018-04-03 HISTORY — DX: Hyperlipidemia, unspecified: E78.5

## 2018-04-03 NOTE — Patient Instructions (Signed)
Medication Instructions:  Your physician recommends that you continue on your current medications as directed. Please refer to the Current Medication list given to you today.  If you need a refill on your cardiac medications before your next appointment, please call your pharmacy.   Lab work: None.  If you have labs (blood work) drawn today and your tests are completely normal, you will receive your results only by: . MyChart Message (if you have MyChart) OR . A paper copy in the mail If you have any lab test that is abnormal or we need to change your treatment, we will call you to review the results.  Testing/Procedures: Your physician has requested that you have an echocardiogram. Echocardiography is a painless test that uses sound waves to create images of your heart. It provides your doctor with information about the size and shape of your heart and how well your heart's chambers and valves are working. This procedure takes approximately one hour. There are no restrictions for this procedure.  Your physician has recommended that you wear an event monitor. Event monitors are medical devices that record the heart's electrical activity. Doctors most often us these monitors to diagnose arrhythmias. Arrhythmias are problems with the speed or rhythm of the heartbeat. The monitor is a small, portable device. You can wear one while you do your normal daily activities. This is usually used to diagnose what is causing palpitations/syncope (passing out). Wear for 30 days        Follow-Up: At [facility], you and your health needs are our priority.  As part of our continuing mission to provide you with exceptional heart care, we have created designated Provider Care Teams.  These Care Teams include your primary Cardiologist (physician) and Advanced Practice Providers (APPs -  Physician Assistants and Nurse Practitioners) who all work together to provide you with the care you need, when you need  it. You will need a follow up appointment in 6 weeks.  Please call our office 2 months in advance to schedule this appointment.  You may see No primary care provider on file. or another member of our [facility] Provider Team in [place]: Brian Munley, MD . Rajan Revankar, MD  Any Other Special Instructions Will Be Listed Below (If Applicable).  Echocardiogram An echocardiogram, or echocardiography, uses sound waves (ultrasound) to produce an image of your heart. The echocardiogram is simple, painless, obtained within a short period of time, and offers valuable information to your health care provider. The images from an echocardiogram can provide information such as:  Evidence of coronary artery disease (CAD).  Heart size.  Heart muscle function.  Heart valve function.  Aneurysm detection.  Evidence of a past heart attack.  Fluid buildup around the heart.  Heart muscle thickening.  Assess heart valve function.  Tell a health care provider about:  Any allergies you have.  All medicines you are taking, including vitamins, herbs, eye drops, creams, and over-the-counter medicines.  Any problems you or family members have had with anesthetic medicines.  Any blood disorders you have.  Any surgeries you have had.  Any medical conditions you have.  Whether you are pregnant or may be pregnant. What happens before the procedure? No special preparation is needed. Eat and drink normally. What happens during the procedure?  In order to produce an image of your heart, gel will be applied to your chest and a wand-like tool (transducer) will be moved over your chest. The gel will help transmit the   sound waves from the transducer. The sound waves will harmlessly bounce off your heart to allow the heart images to be captured in real-time motion. These images will then be recorded.  You may need an IV to receive a medicine that improves the quality of the pictures. What happens  after the procedure? You may return to your normal schedule including diet, activities, and medicines, unless your health care provider tells you otherwise. This information is not intended to replace advice given to you by your health care provider. Make sure you discuss any questions you have with your health care provider. Document Released: 05/26/2000 Document Revised: 01/15/2016 Document Reviewed: 02/03/2013 Elsevier Interactive Patient Education  2017 [employer]    Cardiac Event Monitoring A cardiac event monitor is a small recording device that is used to detect abnormal heart rhythms (arrhythmias). The monitor is used to record your heart rhythm when you have symptoms, such as:  Fast heartbeats (palpitations), such as heart racing or fluttering.  Dizziness.  Fainting or light-headedness.  Unexplained weakness.  Some monitors are wired to electrodes placed on your chest. Electrodes are flat, sticky disks that attach to your skin. Other monitors may be hand-held or worn on the wrist. The monitor can be worn for up to 30 days. If the monitor is attached to your chest, a technician will prepare your chest for the electrode placement and show you how to work the monitor. Take time to practice using the monitor before you leave the office. Make sure you understand how to send the information from the monitor to your health care provider. In some cases, you may need to use a landline telephone instead of a cell phone. What are the risks? Generally, this device is safe to use, but it possible that the skin under the electrodes will become irritated. How to use your cardiac event monitor  Wear your monitor at all times, except when you are in water: ? Do not let the monitor get wet. ? Take the monitor off when you bathe. Do not swim or use a hot tub with it on.  Keep your skin clean. Do not put body lotion or moisturizer on your chest.  Change the electrodes as told by your health  care provider or any time they stop sticking to your skin. You may need to use medical tape to keep them on.  Try to put the electrodes in slightly different places on your chest to help prevent skin irritation. They must remain in the area under your left breast and in the upper right section of your chest.  Make sure the monitor is safely clipped to your clothing or in a location close to your body that your health care provider recommends.  Press the button to record as soon as you feel heart-related symptoms, such as: ? Dizziness. ? Weakness. ? Light-headedness. ? Palpitations. ? Thumping or pounding in your chest. ? Shortness of breath. ? Unexplained weakness.  Keep a diary of your activities, such as walking, doing chores, and taking medicine. It is very important to note what you were doing when you pushed the button to record your symptoms. This will help your health care provider determine what might be contributing to your symptoms.  Send the recorded information as recommended by your health care provider. It may take some time for your health care provider to process the results.  Change the batteries as told by your health care provider.  Keep electronic devices away from your monitor.   This includes: ? Tablets. ? MP3 players. ? Cell phones.  While wearing your monitor you should avoid: ? Electric blankets. ? Electric razors. ? Electric toothbrushes. ? Microwave ovens. ? Magnets. ? Metal detectors. Get help right away if:  You have chest pain.  You have extreme difficulty breathing or shortness of breath.  You develop a very fast heartbeat that persists.  You develop dizziness that does not go away.  You faint or constantly feel like you are about to faint. Summary  A cardiac event monitor is a small recording device that is used to help detect abnormal heart rhythms (arrhythmias).  The monitor is used to record your heart rhythm when you have heart-related  symptoms.  Make sure you understand how to send the information from the monitor to your health care provider.  It is important to press the button on the monitor when you have any heart-related symptoms.  Keep a diary of your activities, such as walking, doing chores, and taking medicine. It is very important to note what you were doing when you pushed the button to record your symptoms. This will help your health care provider learn what might be causing your symptoms. This information is not intended to replace advice given to you by your health care provider. Make sure you discuss any questions you have with your health care provider. Document Released: 03/07/2008 Document Revised: 05/13/2016 Document Reviewed: 05/13/2016 Elsevier Interactive Patient Education  2017 [employer] Inc[employer]    

## 2018-04-03 NOTE — Progress Notes (Signed)
Cardiology Consultation:    Date:  04/03/2018   ID:  Darryl Curry, DOB 1960-11-12, MRN [id number]  PCP:  Sharlene Dory, DO  Cardiologist:  Gypsy Balsam, MD   Referring MD: Sharlene Dory*   Chief Complaint  Patient presents with  . Palpitations  Palpitations  History of Present Illness:    Darryl Curry is a 57 y.o. male who is being seen today for the evaluation of palpitations at the request of Sharlene Dory*.  For few years has been experiencing palpitations what he means by that his heart spitting up with irregular beats.  Initially it was very rare lasting only for short.  Of time but lately became more frequent and lasting longer he described one episode lasting more than half an hour he actually went to his primary care physician he was sent to the emergency room at the time that he got to the emergency room palpitations with known overall he does consider himself in good shape he works he works a lot have no difficulty walking climbing stairs no chest pain tightness squeezing pressure burning chest. He does not snore at night. He drinks about 2-3 beers 3 times a week. Does have family history of coronary artery disease some premature. Does have dyslipidemia which was with recently recognized not treated yet  Past Medical History:  Diagnosis Date  . No known health problems     Past Surgical History:  Procedure Laterality Date  . APPENDECTOMY  1993  . HERNIA REPAIR  2005   right 2005 and left 2008  . VASECTOMY  2007    Current Medications: Current Meds  Medication Sig  . cetirizine (ZYRTEC) 10 MG tablet Take 10 mg by mouth daily.  . Ibuprofen 200 MG CAPS Take by mouth.     Allergies:   Patient has no known allergies.   Social History   Socioeconomic History  . Marital status: Married    Spouse name: Not on file  . Number of children: Not on file  . Years of education: Not on file  . Highest education level: Not on file    Occupational History  . Not on file  Social Needs  . Financial resource strain: Not on file  . Food insecurity:    Worry: Not on file    Inability: Not on file  . Transportation needs:    Medical: Not on file    Non-medical: Not on file  Tobacco Use  . Smoking status: Former Games developer  . Smokeless tobacco: Never Used  Substance and Sexual Activity  . Alcohol use: Yes    Frequency: Never  . Drug use: Never  . Sexual activity: Not on file  Lifestyle  . Physical activity:    Days per week: Not on file    Minutes per session: Not on file  . Stress: Not on file  Relationships  . Social connections:    Talks on phone: Not on file    Gets together: Not on file    Attends religious service: Not on file    Active member of club or organization: Not on file    Attends meetings of clubs or organizations: Not on file    Relationship status: Not on file  Other Topics Concern  . Not on file  Social History Narrative  . Not on file     Family History: The patient's family history includes Cancer in his sister; Heart attack in his mother; Heart disease in his  mother. ROS:   Please see the history of present illness.    All 14 point review of systems negative except as described per history of present illness.  EKGs/Labs/Other Studies Reviewed:    The following studies were reviewed today:   EKG:  EKG is normal sinus rhythm possible left atrial enlargement normal QS complex duration morphology no ST-T segment changes ordered today.  The ekg ordered today demonstrates   Recent Labs: 12/28/2017: ALT 20 01/14/2018: BUN 10; Creatinine, Ser 1.04; Hemoglobin 14.6; Platelets 240; Potassium 4.2; Sodium 138 01/18/2018: TSH 2.67  Recent Lipid Panel    Component Value Date/Time   CHOL 253 (H) 04/02/2018 0814   TRIG 80.0 04/02/2018 0814   HDL 68.40 04/02/2018 0814   CHOLHDL 4 04/02/2018 0814   VLDL 16.0 04/02/2018 0814   LDLCALC 169 (H) 04/02/2018 0814    Physical Exam:    VS:  BP  114/80   Pulse 69   Ht 6' (1.829 m)   Wt 152 lb 6.4 oz (69.1 kg)   SpO2 98%   BMI 20.67 kg/m     Wt Readings from Last 3 Encounters:  04/03/18 152 lb 6.4 oz (69.1 kg)  01/18/18 156 lb (70.8 kg)  01/14/18 155 lb (70.3 kg)     GEN:  Well nourished, well developed in no acute distress HEENT: Normal NECK: No JVD; No carotid bruits LYMPHATICS: No lymphadenopathy CARDIAC: RRR, no murmurs, no rubs, no gallops RESPIRATORY:  Clear to auscultation without rales, wheezing or rhonchi  ABDOMEN: Soft, non-tender, non-distended MUSCULOSKELETAL:  No edema; No deformity  SKIN: Warm and dry NEUROLOGIC:  Alert and oriented x 3 PSYCHIATRIC:  Normal affect   ASSESSMENT:    1. Palpitations   2. Dyslipidemia   3. Dyspnea on exertion    PLAN:    In order of problems listed above:  1. Palpitations I suspect atrial fibrillation based on the fact that he describes irregular heartbeats when he gets palpitations.  He will be scheduled to wear event recorder.  I also talked to him about upper watch and I recommend to get it as a part of evaluation who have echocardiogram done to check left ventricular ejection fraction and more importantly left atrial size.  Luckily he does not snore therefore I do not think sleep apnea play significant role here.  He does drink some alcohol which probably will have to reduce if truly he does have atrial fibrillation.  The key obviously will be to establish diagnosis. 2. Dyslipidemia does not have any signs and symptoms of coronary artery disease we will continue monitoring the situation in the future he may require calcium score to stratify and assess need for lipid-lowering therapy. 3. Dyspnea on exertion we will get echocardiogram to assess left ventricular ejection fraction.   Medication Adjustments/Labs and Tests Ordered: Current medicines are reviewed at length with the patient today.  Concerns regarding medicines are outlined above.  No orders of the defined  types were placed in this encounter.  No orders of the defined types were placed in this encounter.   Signed, Georgeanna Lea, MD, [facility] 04/03/2018 12:06 PM    [facility]

## 2018-04-15 ENCOUNTER — Ambulatory Visit (INDEPENDENT_AMBULATORY_CARE_PROVIDER_SITE_OTHER): Payer: 59

## 2018-04-15 ENCOUNTER — Ambulatory Visit (HOSPITAL_BASED_OUTPATIENT_CLINIC_OR_DEPARTMENT_OTHER)
Admission: RE | Admit: 2018-04-15 | Discharge: 2018-04-15 | Disposition: A | Discharge status: Home / Self Care | Payer: 59 | Source: Ambulatory Visit | Attending: Cardiology | Admitting: Cardiology

## 2018-04-15 DIAGNOSIS — R002 Palpitations: Secondary | ICD-10-CM | POA: Diagnosis present

## 2018-04-15 DIAGNOSIS — R0609 Other forms of dyspnea: Secondary | ICD-10-CM | POA: Insufficient documentation

## 2018-04-15 NOTE — Progress Notes (Signed)
  Echocardiogram 2D Echocardiogram has been performed.  Darothy Courtright T Torrian Canion 04/15/2018, 11:36 AM

## 2018-04-19 ENCOUNTER — Telehealth: Payer: Self-pay | Admitting: Emergency Medicine

## 2018-04-19 NOTE — Telephone Encounter (Signed)
Left message for patient to return call regarding results  

## 2018-05-01 ENCOUNTER — Telehealth: Payer: Self-pay | Admitting: Emergency Medicine

## 2018-05-01 NOTE — Telephone Encounter (Signed)
Left message for patient to return call regarding monitor report. 

## 2018-05-02 MED ORDER — METOPROLOL TARTRATE 25 MG PO TABS: 25.0000 mg | ORAL_TABLET | Freq: Two times a day (BID) | ORAL | 1 refills | Status: DC

## 2018-05-02 NOTE — Telephone Encounter (Signed)
Patient informed of monitor report and Dr. Vanetta ShawlKrasowski's recommendation to start metoprolol tartrate 25 mg twice daily. He verbally understands.

## 2018-05-02 NOTE — Addendum Note (Signed)
Addended by: Lita MainsBOWMAN, Sohail Capraro R on: 05/02/2018 08:29 AM   Modules accepted: Orders

## 2018-05-28 ENCOUNTER — Encounter: Payer: Self-pay | Admitting: Cardiology

## 2018-05-28 ENCOUNTER — Ambulatory Visit (INDEPENDENT_AMBULATORY_CARE_PROVIDER_SITE_OTHER): Payer: 59 | Admitting: Cardiology

## 2018-05-28 VITALS — BP 114/68 | HR 54 | Ht 72.0 in | Wt 156.4 lb

## 2018-05-28 DIAGNOSIS — R0609 Other forms of dyspnea: Secondary | ICD-10-CM

## 2018-05-28 DIAGNOSIS — R002 Palpitations: Secondary | ICD-10-CM

## 2018-05-28 DIAGNOSIS — I471 Supraventricular tachycardia, unspecified: Secondary | ICD-10-CM

## 2018-05-28 DIAGNOSIS — E785 Hyperlipidemia, unspecified: Secondary | ICD-10-CM | POA: Diagnosis not present

## 2018-05-28 HISTORY — DX: Supraventricular tachycardia: I47.1

## 2018-05-28 HISTORY — DX: Supraventricular tachycardia, unspecified: I47.10

## 2018-05-28 MED ORDER — METOPROLOL SUCCINATE ER 50 MG PO TB24: 50.0000 mg | ORAL_TABLET | Freq: Every day | ORAL | 1 refills | Status: DC

## 2018-05-28 NOTE — Progress Notes (Signed)
Cardiology Office Note:    Date:  05/28/2018   ID:  Darryl Curry, DOB 09-May-1961, MRN [id number]  PCP:  Sharlene DoryWendling, Nicholas Paul, DO  Cardiologist:  Gypsy Balsamobert Jerita Wimbush, MD    Referring MD: Sharlene DoryWendling, Nicholas Paul*   Chief Complaint  Patient presents with  . Follow up on testing  Had one episode of palpitations  History of Present Illness:    Darryl Curry is a 57 y.o. male with palpitations monitor showed narrow complex tachycardia is difficult to distinguish between supraventricular tachycardia atrial flutter with 2-1 AV conduction.  However his chads 2 Vascor equals 0, therefore I initiated small dose of beta-blocker will switch him to long-acting form of beta-blocker we will continue monitoring the situation he did purchase apple watch and he will be able to record his EKG echocardiogram reviewed which showed normal left ventricular ejection fraction normal left atrial size  Past Medical History:  Diagnosis Date  . No known health problems     Past Surgical History:  Procedure Laterality Date  . APPENDECTOMY  1993  . HERNIA REPAIR  2005   right 2005 and left 2008  . VASECTOMY  2007    Current Medications: Current Meds  Medication Sig  . cetirizine (ZYRTEC) 10 MG tablet Take 10 mg by mouth daily.  . Ibuprofen 200 MG CAPS Take by mouth.  . metoprolol tartrate (LOPRESSOR) 25 MG tablet Take 1 tablet (25 mg total) by mouth 2 (two) times daily.     Allergies:   Patient has no known allergies.   Social History   Socioeconomic History  . Marital status: Married    Spouse name: Not on file  . Number of children: Not on file  . Years of education: Not on file  . Highest education level: Not on file  Occupational History  . Not on file  Social Needs  . Financial resource strain: Not on file  . Food insecurity:    Worry: Not on file    Inability: Not on file  . Transportation needs:    Medical: Not on file    Non-medical: Not on file  Tobacco Use  . Smoking status:  Former Games developermoker  . Smokeless tobacco: Never Used  Substance and Sexual Activity  . Alcohol use: Yes    Frequency: Never  . Drug use: Never  . Sexual activity: Not on file  Lifestyle  . Physical activity:    Days per week: Not on file    Minutes per session: Not on file  . Stress: Not on file  Relationships  . Social connections:    Talks on phone: Not on file    Gets together: Not on file    Attends religious service: Not on file    Active member of club or organization: Not on file    Attends meetings of clubs or organizations: Not on file    Relationship status: Not on file  Other Topics Concern  . Not on file  Social History Narrative  . Not on file     Family History: The patient's family history includes Cancer in his sister; Heart attack in his mother; Heart disease in his mother. ROS:   Please see the history of present illness.    All 14 point review of systems negative except as described per history of present illness  EKGs/Labs/Other Studies Reviewed:      Recent Labs: 12/28/2017: ALT 20 01/14/2018: BUN 10; Creatinine, Ser 1.04; Hemoglobin 14.6; Platelets 240; Potassium 4.2; Sodium 138  01/18/2018: TSH 2.67  Recent Lipid Panel    Component Value Date/Time   CHOL 253 (H) 04/02/2018 0814   TRIG 80.0 04/02/2018 0814   HDL 68.40 04/02/2018 0814   CHOLHDL 4 04/02/2018 0814   VLDL 16.0 04/02/2018 0814   LDLCALC 169 (H) 04/02/2018 0814    Physical Exam:    VS:  BP 114/68   Pulse (!) 54   Ht 6' (1.829 m)   Wt 156 lb 6.4 oz (70.9 kg)   SpO2 96%   BMI 21.21 kg/m     Wt Readings from Last 3 Encounters:  05/28/18 156 lb 6.4 oz (70.9 kg)  04/03/18 152 lb 6.4 oz (69.1 kg)  01/18/18 156 lb (70.8 kg)     GEN:  Well nourished, well developed in no acute distress HEENT: Normal NECK: No JVD; No carotid bruits LYMPHATICS: No lymphadenopathy CARDIAC: RRR, no murmurs, no rubs, no gallops RESPIRATORY:  Clear to auscultation without rales, wheezing or rhonchi    ABDOMEN: Soft, non-tender, non-distended MUSCULOSKELETAL:  No edema; No deformity  SKIN: Warm and dry LOWER EXTREMITIES: no swelling NEUROLOGIC:  Alert and oriented x 3 PSYCHIATRIC:  Normal affect   ASSESSMENT:    1. Palpitations   2. Dyspnea on exertion   3. Dyslipidemia   4. Supraventricular tachycardia (HCC)    PLAN:    In order of problems listed above:  1. Palpitations most likely supraventricular tachycardia beta-blocker given we will switch him to long-acting form. 2. Dyspnea on exertion echocardiogram showed preserved left ventricular ejection fraction. 3. Dyslipidemia diet and exercise and continue monitoring.  I bring him back in 3 months we will talk about medications   Medication Adjustments/Labs and Tests Ordered: Current medicines are reviewed at length with the patient today.  Concerns regarding medicines are outlined above.  No orders of the defined types were placed in this encounter.  Medication changes: No orders of the defined types were placed in this encounter.   Signed, Georgeanna Lea, MD, [facility] 05/28/2018 11:27 AM    [facility]

## 2018-05-28 NOTE — Patient Instructions (Signed)
Medication Instructions:  Your physician has recommended you make the following change in your medication:   STOP: Metoprolol tartrate.  START: Metoprolol succinate 50 mg daily.  If you need a refill on your cardiac medications before your next appointment, please call your pharmacy.   Lab work: None.  If you have labs (blood work) drawn today and your tests are completely normal, you will receive your results only by: Marland Kitchen. MyChart Message (if you have MyChart) OR . A paper copy in the mail If you have any lab test that is abnormal or we need to change your treatment, we will call you to review the results.  Testing/Procedures: None.   Follow-Up: At [facility], you and your health needs are our priority.  As part of our continuing mission to provide you with exceptional heart care, we have created designated Provider Care Teams.  These Care Teams include your primary Cardiologist (physician) and Advanced Practice Providers (APPs -  Physician Assistants and Nurse Practitioners) who all work together to provide you with the care you need, when you need it. You will need a follow up appointment in 3 months.  Please call our office 2 months in advance to schedule this appointment.  You may see Gypsy Balsamobert Krasowski, MD or another member of our [facility] Provider Team in [place]: Norman HerrlichBrian Munley, MD . Belva Cromeajan Revankar, MD  Any Other Special Instructions Will Be Listed Below (If Applicable).  Metoprolol extended-release tablets What is this medicine? METOPROLOL (me TOE proe lole) is a beta-blocker. Beta-blockers reduce the workload on the heart and help it to beat more regularly. This medicine is used to treat high blood pressure and to prevent chest pain. It is also used to after a heart attack and to prevent an additional heart attack from occurring. This medicine may be used for other purposes; ask your health care provider or pharmacist if you have questions. COMMON BRAND NAME(S): toprol,  Toprol XL What should I tell my health care provider before I take this medicine? They need to know if you have any of these conditions: -diabetes -heart or vessel disease like slow heart rate, worsening heart failure, heart block, sick sinus syndrome or Raynaud's disease -kidney disease -liver disease -lung or breathing disease, like asthma or emphysema -pheochromocytoma -thyroid disease -an unusual or allergic reaction to metoprolol, other beta-blockers, medicines, foods, dyes, or preservatives -pregnant or trying to get pregnant -breast-feeding How should I use this medicine? Take this medicine by mouth with a glass of water. Follow the directions on the prescription label. Do not crush or chew. Take this medicine with or immediately after meals. Take your doses at regular intervals. Do not take more medicine than directed. Do not stop taking this medicine suddenly. This could lead to serious heart-related effects. Talk to your pediatrician regarding the use of this medicine in children. While this drug may be prescribed for children as young as 6 years for selected conditions, precautions do apply. Overdosage: If you think you have taken too much of this medicine contact a poison control center or emergency room at once. NOTE: This medicine is only for you. Do not share this medicine with others. What if I miss a dose? If you miss a dose, take it as soon as you can. If it is almost time for your next dose, take only that dose. Do not take double or extra doses. What may interact with this medicine? This medicine may interact with the following medications: -certain medicines for blood  pressure, heart disease, irregular heart beat -certain medicines for depression, like monoamine oxidase (MAO) inhibitors, fluoxetine, or paroxetine -clonidine -dobutamine -epinephrine -isoproterenol -reserpine This list may not describe all possible interactions. Give your health care provider a list of  all the medicines, herbs, non-prescription drugs, or dietary supplements you use. Also tell them if you smoke, drink alcohol, or use illegal drugs. Some items may interact with your medicine. What should I watch for while using this medicine? Visit your doctor or health care professional for regular check ups. Contact your doctor right away if your symptoms worsen. Check your blood pressure and pulse rate regularly. Ask your health care professional what your blood pressure and pulse rate should be, and when you should contact them. You may get drowsy or dizzy. Do not drive, use machinery, or do anything that needs mental alertness until you know how this medicine affects you. Do not sit or stand up quickly, especially if you are an older patient. This reduces the risk of dizzy or fainting spells. Contact your doctor if these symptoms continue. Alcohol may interfere with the effect of this medicine. Avoid alcoholic drinks. What side effects may I notice from receiving this medicine? Side effects that you should report to your doctor or health care professional as soon as possible: -allergic reactions like skin rash, itching or hives -cold or numb hands or feet -depression -difficulty breathing -faint -fever with sore throat -irregular heartbeat, chest pain -rapid weight gain -swollen legs or ankles Side effects that usually do not require medical attention (report to your doctor or health care professional if they continue or are bothersome): -anxiety or nervousness -change in sex drive or performance -dry skin -headache -nightmares or trouble sleeping -short term memory loss -stomach upset or diarrhea -unusually tired This list may not describe all possible side effects. Call your doctor for medical advice about side effects. You may report side effects to FDA at 1-800-FDA-1088. Where should I keep my medicine? Keep out of the reach of children. Store at room temperature between 15 and 30  degrees C (59 and 86 degrees F). Throw away any unused medicine after the expiration date. NOTE: This sheet is a summary. It may not cover all possible information. If you have questions about this medicine, talk to your doctor, pharmacist, or health care provider.  2018 Elsevier/Gold Standard (2013-01-31 14:41:37)

## 2018-08-02 ENCOUNTER — Encounter: Payer: Self-pay | Admitting: Medical

## 2018-08-02 ENCOUNTER — Ambulatory Visit (INDEPENDENT_AMBULATORY_CARE_PROVIDER_SITE_OTHER): Payer: 59 | Admitting: Medical

## 2018-08-02 VITALS — BP 124/89 | HR 66 | Temp 98.3°F | Resp 16 | Ht 72.0 in | Wt 156.0 lb

## 2018-08-02 DIAGNOSIS — J0100 Acute maxillary sinusitis, unspecified: Secondary | ICD-10-CM

## 2018-08-02 DIAGNOSIS — J40 Bronchitis, not specified as acute or chronic: Secondary | ICD-10-CM

## 2018-08-02 MED ORDER — ALBUTEROL SULFATE HFA 108 (90 BASE) MCG/ACT IN AERS: 2.0000 | INHALATION_SPRAY | Freq: Four times a day (QID) | RESPIRATORY_TRACT | 2 refills | Status: DC | PRN

## 2018-08-02 MED ORDER — DOXYCYCLINE HYCLATE 100 MG PO TABS: 100.0000 mg | ORAL_TABLET | Freq: Two times a day (BID) | ORAL | 0 refills | Status: DC

## 2018-08-02 MED ORDER — FLUTICASONE PROPIONATE 50 MCG/ACT NA SUSP: 2.0000 | Freq: Every day | NASAL | 1 refills | Status: DC

## 2018-08-02 MED ORDER — BENZONATATE 100 MG PO CAPS: 100.0000 mg | ORAL_CAPSULE | Freq: Three times a day (TID) | ORAL | 0 refills | Status: DC | PRN

## 2018-08-02 NOTE — Patient Instructions (Addendum)
You appear to have bronchitis and sinusitis(more sinus infection). Rest hydrate and tylenol for fever. I am prescribing cough medicine benzonatate, and doxycycline antibiotic. For your nasal congestion rx flonase  You should gradually get better. If not then notify us and would recommend a chest xray.  For wheezing making albuterol inhaler available.  Follow up in 7-10 days or as needed

## 2018-08-02 NOTE — Progress Notes (Signed)
Subjective:    Patient ID: Darryl Curry, male    DOB: 04-16-1961, 58 y.o.   MRN: [id number]  HPI He has had one week of nasal congestion and chest congestion. He has had cough that is productive. Mild wheezing. He has been traveling a lot.  He feels like he is not getting better as he typically will when just gets uri.  Pt quit smoking 30 years ago.   Review of Systems  Constitutional: Negative for chills and fatigue.       Subective fever and chills 2 nights ago.  HENT: Positive for congestion, sinus pressure and sinus pain.   Respiratory: Positive for cough and wheezing. Negative for chest tightness and shortness of breath.   Cardiovascular: Negative for chest pain and palpitations.  Gastrointestinal: Negative for abdominal pain.  Musculoskeletal: Negative for gait problem and myalgias.  Neurological: Negative for dizziness and headaches.  Psychiatric/Behavioral: Negative for behavioral problems and decreased concentration.    Past Medical History:  Diagnosis Date  . No known health problems      Social History   Socioeconomic History  . Marital status: Married    Spouse name: Not on file  . Number of children: Not on file  . Years of education: Not on file  . Highest education level: Not on file  Occupational History  . Not on file  Social Needs  . Financial resource strain: Not on file  . Food insecurity:    Worry: Not on file    Inability: Not on file  . Transportation needs:    Medical: Not on file    Non-medical: Not on file  Tobacco Use  . Smoking status: Former Games developer  . Smokeless tobacco: Never Used  Substance and Sexual Activity  . Alcohol use: Yes    Frequency: Never  . Drug use: Never  . Sexual activity: Not on file  Lifestyle  . Physical activity:    Days per week: Not on file    Minutes per session: Not on file  . Stress: Not on file  Relationships  . Social connections:    Talks on phone: Not on file    Gets together: Not on file   Attends religious service: Not on file    Active member of club or organization: Not on file    Attends meetings of clubs or organizations: Not on file    Relationship status: Not on file  . Intimate partner violence:    Fear of current or ex partner: Not on file    Emotionally abused: Not on file    Physically abused: Not on file    Forced sexual activity: Not on file  Other Topics Concern  . Not on file  Social History Narrative  . Not on file    Past Surgical History:  Procedure Laterality Date  . APPENDECTOMY  1993  . HERNIA REPAIR  2005   right 2005 and left 2008  . VASECTOMY  2007    Family History  Problem Relation Age of Onset  . Heart disease Mother   . Heart attack Mother   . Cancer Sister     No Known Allergies  Current Outpatient Medications on File Prior to Visit  Medication Sig Dispense Refill  . cetirizine (ZYRTEC) 10 MG tablet Take 10 mg by mouth daily.    . Ibuprofen 200 MG CAPS Take by mouth.    . metoprolol succinate (TOPROL-XL) 50 MG 24 hr tablet Take 1 tablet (50 mg total)  by mouth daily. Take with or immediately following a meal. 90 tablet 1   No current facility-administered medications on file prior to visit.     BP 124/89   Pulse 66   Temp 98.3 F (36.8 C) (Oral)   Resp 16   Ht 6' (1.829 m)   Wt 156 lb (70.8 kg)   SpO2 99%   BMI 21.16 kg/m       Objective:   Physical Exam  General  Mental Status - Alert. General Appearance - Well groomed. Not in acute distress.  Skin Rashes- No Rashes.  HEENT Head- Normal. Ear Auditory Canal - Left- Normal. Right - Normal.Tympanic Membrane- Left- Normal. Right- Normal. Eye Sclera/Conjunctiva- Left- Normal. Right- Normal. Nose & Sinuses Nasal Mucosa- Left-  Boggy and Congested. Right-  Boggy and  Congested.Bilateral maxillary and frontal sinus pressure. Mouth & Throat Lips: Upper Lip- Normal: no dryness, cracking, pallor, cyanosis, or vesicular eruption. Lower Lip-Normal: no dryness,  cracking, pallor, cyanosis or vesicular eruption. Buccal Mucosa- Bilateral- No Aphthous ulcers. Oropharynx- No Discharge or Erythema. Tonsils: Characteristics- Bilateral- No Erythema or Congestion. Size/Enlargement- Bilateral- No enlargement. Discharge- bilateral-None.  Neck Neck- Supple. No Masses.   Chest and Lung Exam Auscultation: Breath Sounds:-Clear even and unlabored.  Cardiovascular Auscultation:Rythm- Regular, rate and rhythm. Murmurs & Other Heart Sounds:Ausculatation of the heart reveal- No Murmurs.  Lymphatic Head & Neck General Head & Neck Lymphatics: Bilateral: Description- No Localized lymphadenopathy.       Assessment & Plan:  You appear to have bronchitis and sinusitis(more sinus infection). Rest hydrate and tylenol for fever. I am prescribing cough medicine benzonatate, and doxycycline antibiotic. For your nasal congestion rx flonase  You should gradually get better. If not then notify us and would recommend a chest xray.   For wheezing making albuterol inhaler available.  Follow up in 7-10 days or as needed  [employer], [employer]

## 2018-09-03 ENCOUNTER — Ambulatory Visit: Payer: 59 | Admitting: Cardiology

## 2018-09-05 ENCOUNTER — Telehealth: Payer: Self-pay | Admitting: Emergency Medicine

## 2018-09-05 NOTE — Telephone Encounter (Signed)
Left message for patient to return call regarding switching visit to a televisit.  

## 2018-09-09 ENCOUNTER — Ambulatory Visit: Payer: 59 | Admitting: Cardiology

## 2018-10-14 ENCOUNTER — Telehealth: Payer: 59 | Admitting: Cardiology

## 2018-11-28 ENCOUNTER — Other Ambulatory Visit: Payer: Self-pay | Admitting: Cardiology

## 2018-12-31 ENCOUNTER — Other Ambulatory Visit: Payer: Self-pay | Admitting: Cardiology

## 2019-01-01 ENCOUNTER — Encounter: Payer: 59 | Admitting: Family Medicine

## 2019-01-14 ENCOUNTER — Encounter: Payer: Self-pay | Admitting: Family Medicine

## 2019-01-14 ENCOUNTER — Ambulatory Visit (INDEPENDENT_AMBULATORY_CARE_PROVIDER_SITE_OTHER): Payer: 59 | Admitting: Family Medicine

## 2019-01-14 ENCOUNTER — Other Ambulatory Visit: Payer: Self-pay

## 2019-01-14 VITALS — BP 120/72 | HR 60 | Temp 97.7°F | Ht 72.0 in | Wt 157.0 lb

## 2019-01-14 DIAGNOSIS — Z0000 Encounter for general adult medical examination without abnormal findings: Secondary | ICD-10-CM | POA: Diagnosis not present

## 2019-01-14 NOTE — Progress Notes (Signed)
Chief Complaint  Patient presents with  . Annual Exam    Well Male Darryl Curry is here for a complete physical.   His last physical was >1 year ago.  Current diet: in general, a "good" diet.  Current exercise: some walking Weight trend: stable Daytime fatigue? No. Seat belt? Yes.    Health maintenance Shingrix- No Colonoscopy- Yes Tetanus- Yes HIV- Yes Hep C- Yes   Past Medical History:  Diagnosis Date  . SVT (supraventricular tachycardia) (HCC)       Past Surgical History:  Procedure Laterality Date  . APPENDECTOMY  1993  . HERNIA REPAIR  2005   right 2005 and left 2008  . VASECTOMY  2007    Medications  Current Outpatient Medications on File Prior to Visit  Medication Sig Dispense Refill  . metoprolol succinate (TOPROL-XL) 50 MG 24 hr tablet TAKE 1 TABLET (50 MG TOTAL) BY MOUTH DAILY. TAKE WITH OR IMMEDIATELY FOLLOWING A MEAL. 30 tablet 0  . cetirizine (ZYRTEC) 10 MG tablet Take 10 mg by mouth daily.    . Ibuprofen 200 MG CAPS Take by mouth.     Allergies No Known Allergies  Family History Family History  Problem Relation Age of Onset  . Heart disease Mother   . Heart attack Mother   . Cancer Sister     Review of Systems: Constitutional:  no fevers Eye:  no recent significant change in vision Ear/Nose/Mouth/Throat:  Ears:  no hearing loss Nose/Mouth/Throat:  no complaints of nasal congestion, no sore throat Cardiovascular:  no chest pain, no palpitations Respiratory:  no cough and no shortness of breath Gastrointestinal:  no abdominal pain, no change in bowel habits GU:  Male: negative for dysuria, frequency, and incontinence and negative for prostate symptoms Musculoskeletal/Extremities:  no pain, redness, or swelling of the joints Integumentary (Skin/Breast):  no abnormal skin lesions reported Neurologic:  no headaches Endocrine: No unexpected weight changes Hematologic/Lymphatic:  no abnormal bleeding  Exam BP 120/72 (BP Location: Left Arm,  Patient Position: Sitting, Cuff Size: Normal)   Pulse 60   Temp 97.7 F (36.5 C) (Oral)   Ht 6' (1.829 m)   Wt 157 lb (71.2 kg)   SpO2 98%   BMI 21.29 kg/m  General:  well developed, well nourished, in no apparent distress Skin:  no significant moles, warts, or growths Head:  no masses, lesions, or tenderness Eyes:  pupils equal and round, sclera anicteric without injection Ears:  canals without lesions, TMs shiny without retraction, no obvious effusion, no erythema Nose:  nares patent, septum midline, mucosa normal Throat/Pharynx:  lips and gingiva without lesion; tongue and uvula midline; non-inflamed pharynx; no exudates or postnasal drainage Neck: neck supple without adenopathy, thyromegaly, or masses Lungs:  clear to auscultation, breath sounds equal bilaterally, no respiratory distress Cardio:  regular rate and rhythm, no LE edema, no bruits Abdomen:  abdomen soft, nontender; bowel sounds normal; no masses or organomegaly Rectal: Deferred Musculoskeletal:  symmetrical muscle groups noted without atrophy or deformity Extremities:  no clubbing, cyanosis, or edema, no deformities, no skin discoloration Neuro:  gait normal; deep tendon reflexes normal and symmetric Psych: well oriented with normal range of affect and appropriate judgment/insight  Assessment and Plan  Well adult exam - Plan: CBC, Comprehensive metabolic panel, Lipid panel  Well 58 y.o. male. Counseled on diet and exercise. Counseled on risks and benefits of prostate cancer screening with PSA. The patient agrees to forego testing. Immunizations, labs, and further orders as above. Follow up  in 1 yr. The patient voiced understanding and agreement to the plan.  [name], DO 01/14/19 2:50 PM

## 2019-01-14 NOTE — Patient Instructions (Addendum)
Give us 2-3 business days to get the results of your labs back.   Keep the diet clean and stay active.  Aim to do some physical exertion for 150 minutes per week. This is typically divided into 5 days per week, 30 minutes per day. The activity should be enough to get your heart rate up. Anything is better than nothing if you have time constraints.  The new Shingrix vaccine (for shingles) is a 2 shot series. It can make people feel low energy, achy and almost like they have the flu for 48 hours after injection. Please plan accordingly when deciding on when to get this shot. Call our office for a nurse visit appointment to get this. The second shot of the series is less severe regarding the side effects, but it still lasts 48 hours.   Let us know if you need anything.  

## 2019-01-15 LAB — COMPREHENSIVE METABOLIC PANEL
ALT: 18 U/L (ref 0–53)
AST: 17 U/L (ref 0–37)
Albumin: 4.4 g/dL (ref 3.5–5.2)
Alkaline Phosphatase: 65 U/L (ref 39–117)
BUN: 15 mg/dL (ref 6–23)
CO2: 28 mEq/L (ref 19–32)
Calcium: 9.5 mg/dL (ref 8.4–10.5)
Chloride: 100 mEq/L (ref 96–112)
Creatinine, Ser: 1.08 mg/dL (ref 0.40–1.50)
GFR: 69.76 mL/min (ref >60.00)
Glucose, Bld: 106 mg/dL — ABNORMAL HIGH (ref 70–99)
Potassium: 4.6 mEq/L (ref 3.5–5.1)
Sodium: 134 mEq/L — ABNORMAL LOW (ref 135–145)
Total Bilirubin: 1 mg/dL (ref 0.2–1.2)
Total Protein: 6.7 g/dL (ref 6.0–8.3)

## 2019-01-15 LAB — LIPID PANEL
Cholesterol: 222 mg/dL — ABNORMAL HIGH (ref 0–200)
HDL: 57.4 mg/dL (ref >39.00)
LDL Cholesterol: 144 mg/dL — ABNORMAL HIGH (ref 0–99)
NonHDL: 164.29
Total CHOL/HDL Ratio: 4
Triglycerides: 99 mg/dL (ref 0.0–149.0)
VLDL: 19.8 mg/dL (ref 0.0–40.0)

## 2019-01-15 LAB — CBC
HCT: 42.1 % (ref 39.0–52.0)
Hemoglobin: 13.9 g/dL (ref 13.0–17.0)
MCHC: 33.1 g/dL (ref 30.0–36.0)
MCV: 96.6 fl (ref 78.0–100.0)
Platelets: 275 10*3/uL (ref 150.0–400.0)
RBC: 4.35 Mil/uL (ref 4.22–5.81)
RDW: 13.3 % (ref 11.5–15.5)
WBC: 6.9 10*3/uL (ref 4.0–10.5)

## 2019-01-28 ENCOUNTER — Other Ambulatory Visit: Payer: Self-pay | Admitting: Cardiology

## 2019-01-28 ENCOUNTER — Telehealth: Payer: Self-pay | Admitting: Cardiology

## 2019-01-28 NOTE — Telephone Encounter (Signed)
°*  STAT* If patient is at the pharmacy, call can be transferred to refill team.   1. Which medications need to be refilled? (please list name of each medication and dose if known) Metoprolol 50mg   2. Which pharmacy/location (including street and city if local pharmacy) is medication to be sent to? [facility] on [address] [place]  3. Do they need a 30 day or 90 day supply? Darryl Curry

## 2019-01-29 NOTE — Telephone Encounter (Signed)
Medication refilled

## 2019-01-30 ENCOUNTER — Other Ambulatory Visit: Payer: Self-pay | Admitting: *Deleted

## 2019-01-30 MED ORDER — METOPROLOL SUCCINATE ER 50 MG PO TB24: 50.0000 mg | ORAL_TABLET | Freq: Every day | ORAL | 0 refills | Status: DC

## 2019-02-03 ENCOUNTER — Ambulatory Visit (INDEPENDENT_AMBULATORY_CARE_PROVIDER_SITE_OTHER): Payer: 59 | Admitting: Cardiology

## 2019-02-03 ENCOUNTER — Encounter: Payer: Self-pay | Admitting: Cardiology

## 2019-02-03 ENCOUNTER — Other Ambulatory Visit: Payer: Self-pay

## 2019-02-03 VITALS — BP 118/72 | HR 52 | Ht 72.0 in | Wt 157.8 lb

## 2019-02-03 DIAGNOSIS — I471 Supraventricular tachycardia: Secondary | ICD-10-CM

## 2019-02-03 DIAGNOSIS — R002 Palpitations: Secondary | ICD-10-CM | POA: Diagnosis not present

## 2019-02-03 DIAGNOSIS — R0609 Other forms of dyspnea: Secondary | ICD-10-CM

## 2019-02-03 NOTE — Patient Instructions (Signed)
Medication Instructions:  Your physician recommends that you continue on your current medications as directed. Please refer to the Current Medication list given to you today.  If you need a refill on your cardiac medications before your next appointment, please call your pharmacy.   Lab work: None.  If you have labs (blood work) drawn today and your tests are completely normal, you will receive your results only by: . MyChart Message (if you have MyChart) OR . A paper copy in the mail If you have any lab test that is abnormal or we need to change your treatment, we will call you to review the results.  Testing/Procedures: None.   Follow-Up: At [facility], you and your health needs are our priority.  As part of our continuing mission to provide you with exceptional heart care, we have created designated Provider Care Teams.  These Care Teams include your primary Cardiologist (physician) and Advanced Practice Providers (APPs -  Physician Assistants and Nurse Practitioners) who all work together to provide you with the care you need, when you need it. You will need a follow up appointment in 5 months.  Please call our office 2 months in advance to schedule this appointment.  You may see Robert Krasowski, MD or another member of our [facility] Provider Team in [place]: Brian Munley, MD . Rajan Revankar, MD  Any Other Special Instructions Will Be Listed Below (If Applicable).     

## 2019-02-03 NOTE — Progress Notes (Signed)
Cardiology Office Note:    Date:  02/03/2019   ID:  Darryl Curry, DOB July 26, 1960, MRN [id number]  PCP:  Shelda Pal, DO  Cardiologist:  Jenne Campus, MD    Referring MD: Shelda Pal*   Chief Complaint  Patient presents with  . Follow-up  I had one episode of palpitations  History of Present Illness:    Darryl Curry is a 58 y.o. male with supraventricular tachycardia.  I put him on a beta-blocker with very good effect 1 day however he forgot to take it and then his heart starts beating up.  He does have Apple Watch and he recorded his arrhythmia it is fast heart rate of 188 somewhat irregular.  I am raising suspicion for potentially atrial fibrillation.  Luckily his chads 2 vascular equals 0 therefore there is no issue with anticoagulation.  I told him to take his medication on a regular basis and then will talk again about 6 months.  I also gave him my email address if he have any palpitation he need to send me recording second just the fact what it is.  Past Medical History:  Diagnosis Date  . SVT (supraventricular tachycardia) (HCC)     Past Surgical History:  Procedure Laterality Date  . APPENDECTOMY  1993  . HERNIA REPAIR  2005   right 2005 and left 2008  . VASECTOMY  2007    Current Medications: Current Meds  Medication Sig  . cetirizine (ZYRTEC) 10 MG tablet Take 10 mg by mouth daily.  . Ibuprofen 200 MG CAPS Take by mouth.  . metoprolol succinate (TOPROL-XL) 50 MG 24 hr tablet Take 1 tablet (50 mg total) by mouth daily. Take with or immediately following a meal.KEEP OFFICE APPOINTMENT     Allergies:   Patient has no known allergies.   Social History   Socioeconomic History  . Marital status: Married    Spouse name: Not on file  . Number of children: Not on file  . Years of education: Not on file  . Highest education level: Not on file  Occupational History  . Not on file  Social Needs  . Financial resource strain: Not on file   . Food insecurity    Worry: Not on file    Inability: Not on file  . Transportation needs    Medical: Not on file    Non-medical: Not on file  Tobacco Use  . Smoking status: Former Research scientist (life sciences)  . Smokeless tobacco: Never Used  Substance and Sexual Activity  . Alcohol use: Yes    Frequency: Never  . Drug use: Never  . Sexual activity: Not on file  Lifestyle  . Physical activity    Days per week: Not on file    Minutes per session: Not on file  . Stress: Not on file  Relationships  . Social Herbalist on phone: Not on file    Gets together: Not on file    Attends religious service: Not on file    Active member of club or organization: Not on file    Attends meetings of clubs or organizations: Not on file    Relationship status: Not on file  Other Topics Concern  . Not on file  Social History Narrative  . Not on file     Family History: The patient's family history includes Cancer in his sister; Heart attack in his mother; Heart disease in his mother. ROS:   Please see the history  of present illness.    All 14 point review of systems negative except as described per history of present illness  EKGs/Labs/Other Studies Reviewed:      Recent Labs: 01/14/2019: ALT 18; BUN 15; Creatinine, Ser 1.08; Hemoglobin 13.9; Platelets 275.0; Potassium 4.6; Sodium 134  Recent Lipid Panel    Component Value Date/Time   CHOL 222 (H) 01/14/2019 1511   TRIG 99.0 01/14/2019 1511   HDL 57.40 01/14/2019 1511   CHOLHDL 4 01/14/2019 1511   VLDL 19.8 01/14/2019 1511   LDLCALC 144 (H) 01/14/2019 1511    Physical Exam:    VS:  BP 118/72   Pulse (!) 52   Ht 6' (1.829 m)   Wt 157 lb 12.8 oz (71.6 kg)   SpO2 98%   BMI 21.40 kg/m     Wt Readings from Last 3 Encounters:  02/03/19 157 lb 12.8 oz (71.6 kg)  01/14/19 157 lb (71.2 kg)  08/02/18 156 lb (70.8 kg)     GEN:  Well nourished, well developed in no acute distress HEENT: Normal NECK: No JVD; No carotid bruits  LYMPHATICS: No lymphadenopathy CARDIAC: RRR, no murmurs, no rubs, no gallops RESPIRATORY:  Clear to auscultation without rales, wheezing or rhonchi  ABDOMEN: Soft, non-tender, non-distended MUSCULOSKELETAL:  No edema; No deformity  SKIN: Warm and dry LOWER EXTREMITIES: no swelling NEUROLOGIC:  Alert and oriented x 3 PSYCHIATRIC:  Normal affect   ASSESSMENT:    1. Supraventricular tachycardia (HCC)   2. Palpitations   3. Dyspnea on exertion    PLAN:    In order of problems listed above:  1. Supraventricular tachycardia plan as outlined noted above.  Will make sure he takes his medication on the regular basis 2. Palpitations plan as above 3. Dyspnea on exertion denies having any   Medication Adjustments/Labs and Tests Ordered: Current medicines are reviewed at length with the patient today.  Concerns regarding medicines are outlined above.  No orders of the defined types were placed in this encounter.  Medication changes: No orders of the defined types were placed in this encounter.   Signed, Georgeanna Leaobert J. Jaamal Farooqui, MD, [facility] 02/03/2019 11:43 AM    [facility]

## 2019-04-22 ENCOUNTER — Other Ambulatory Visit: Payer: Self-pay | Admitting: Cardiology

## 2019-06-16 IMAGING — DX DG CHEST 2V
2 series · 2 of 2 positions shown · non-contrast
Comparison: 07/02/2008

CLINICAL DATA: Palpitation and shortness of breath for 1 hour

EXAM:
CHEST - 2 VIEW

[chest pa]
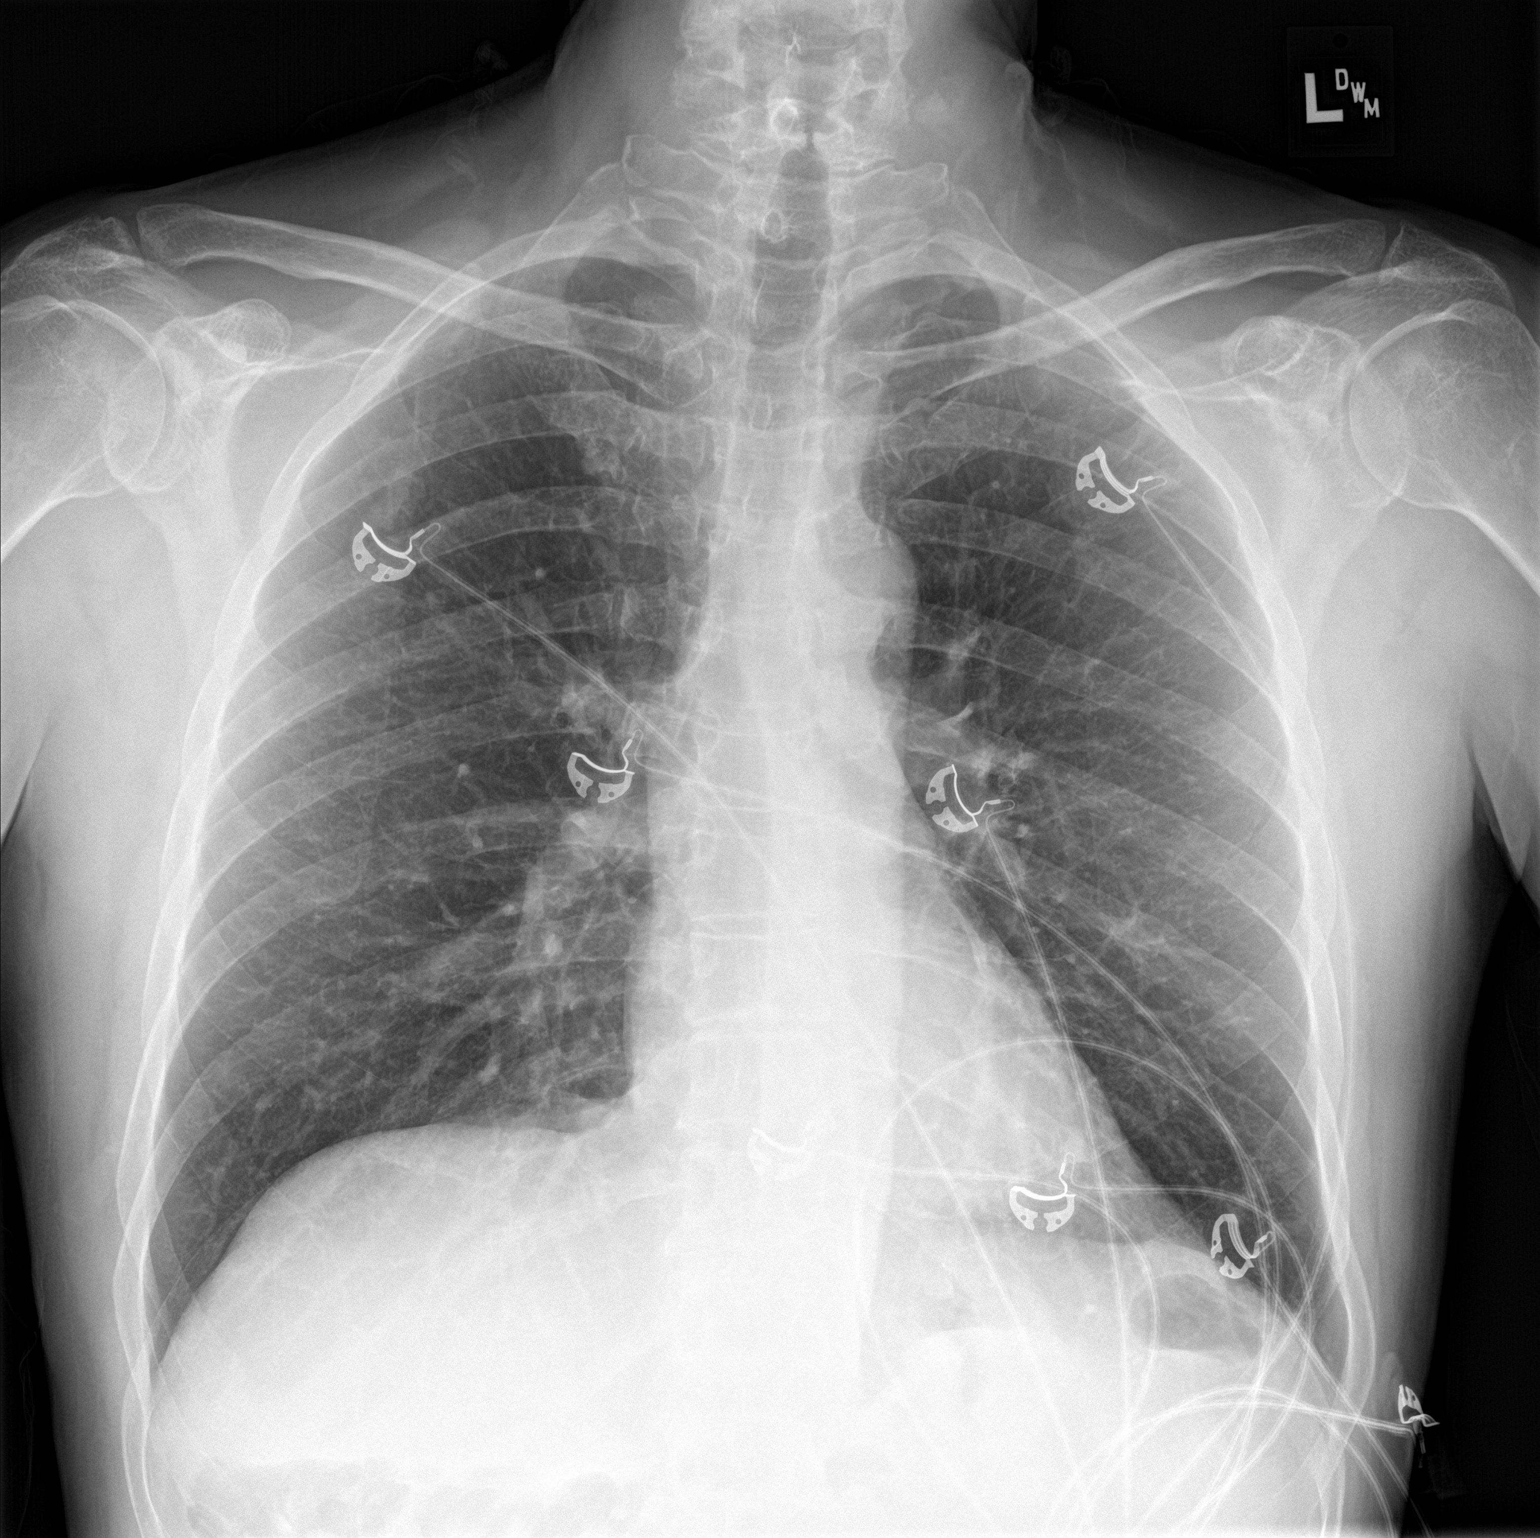

[chest lat]
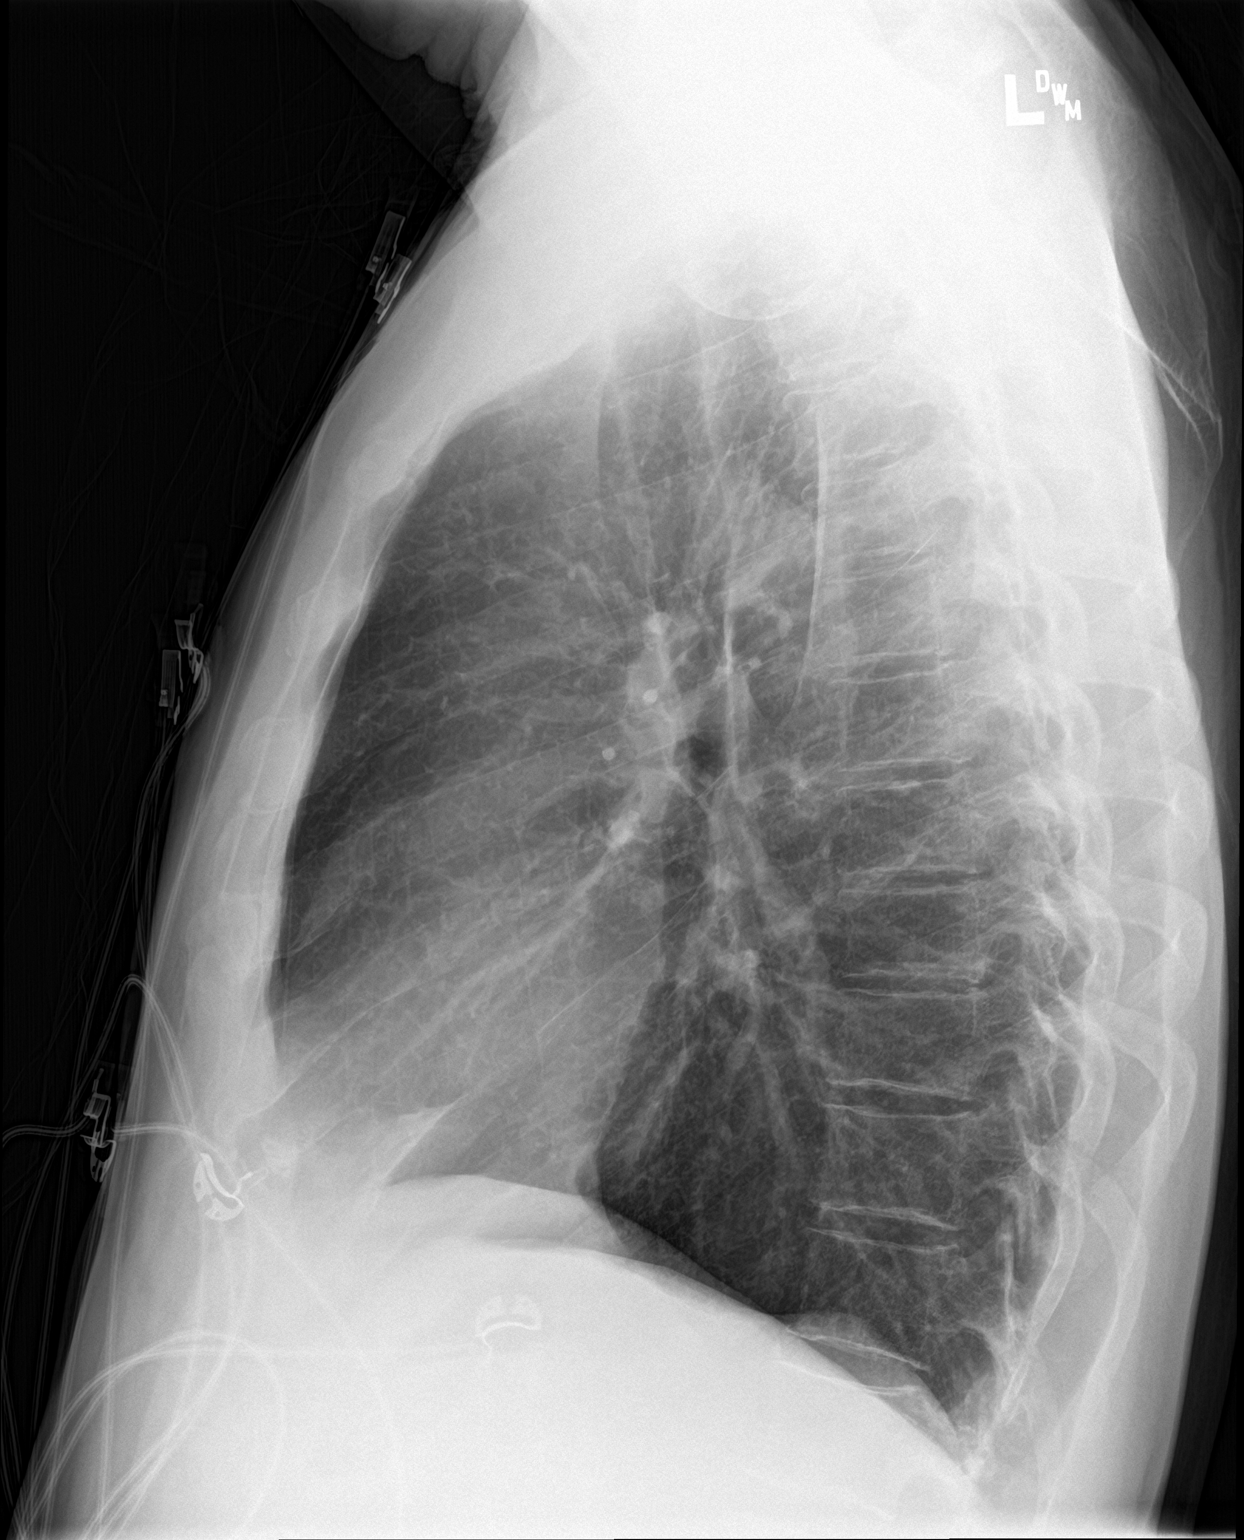

[2 of 2 positions shown; findings below may reference images not displayed]

FINDINGS: Artifact from EKG leads. There is no edema, consolidation, effusion,
or pneumothorax. Normal heart size and mediastinal contours.
IMPRESSION: Negative chest.

## 2019-07-11 ENCOUNTER — Other Ambulatory Visit: Payer: Self-pay

## 2019-07-11 ENCOUNTER — Ambulatory Visit (INDEPENDENT_AMBULATORY_CARE_PROVIDER_SITE_OTHER): Payer: No Typology Code available for payment source | Admitting: Cardiology

## 2019-07-11 ENCOUNTER — Encounter: Payer: Self-pay | Admitting: Cardiology

## 2019-07-11 VITALS — BP 112/70 | HR 64 | Ht 72.0 in | Wt 160.0 lb

## 2019-07-11 DIAGNOSIS — E785 Hyperlipidemia, unspecified: Secondary | ICD-10-CM

## 2019-07-11 DIAGNOSIS — R002 Palpitations: Secondary | ICD-10-CM

## 2019-07-11 DIAGNOSIS — I471 Supraventricular tachycardia: Secondary | ICD-10-CM

## 2019-07-11 DIAGNOSIS — I480 Paroxysmal atrial fibrillation: Secondary | ICD-10-CM

## 2019-07-11 DIAGNOSIS — R0600 Dyspnea, unspecified: Secondary | ICD-10-CM

## 2019-07-11 DIAGNOSIS — R0609 Other forms of dyspnea: Secondary | ICD-10-CM

## 2019-07-11 HISTORY — DX: Paroxysmal atrial fibrillation: I48.0

## 2019-07-11 NOTE — Patient Instructions (Signed)
Medication Instructions:  Your physician recommends that you continue on your current medications as directed. Please refer to the Current Medication list given to you today.  *If you need a refill on your cardiac medications before your next appointment, please call your pharmacy*  Lab Work: None.  If you have labs (blood work) drawn today and your tests are completely normal, you will receive your results only by: . MyChart Message (if you have MyChart) OR . A paper copy in the mail If you have any lab test that is abnormal or we need to change your treatment, we will call you to review the results.  Testing/Procedures: None.   Follow-Up: At [facility], you and your health needs are our priority.  As part of our continuing mission to provide you with exceptional heart care, we have created designated Provider Care Teams.  These Care Teams include your primary Cardiologist (physician) and Advanced Practice Providers (APPs -  Physician Assistants and Nurse Practitioners) who all work together to provide you with the care you need, when you need it.  Your next appointment:   6 month(s)  The format for your next appointment:   In Person  Provider:   You may see Robert Krasowski, MD or the following Advanced Practice Provider on your designated Care Team:    Caitlin Walker, FNP   Other Instructions    

## 2019-07-11 NOTE — Progress Notes (Signed)
Cardiology Office Note:    Date:  07/11/2019   ID:  Darryl Curry, DOB [birth year], MRN [id number]  PCP:  Shelda Pal, DO  Cardiologist:  Jenne Campus, MD    Referring MD: Shelda Pal*   Chief Complaint  Patient presents with  . Follow-up    Medication for AFIB     History of Present Illness:    Darryl Curry is a 59 y.o. male with past medical history significant for SVT, he did have one episode of atrial flutter.  Also palpitations as well as dyslipidemia.  Comes today to my office for follow-up.  Since have seen him last time he had one episode of atrial fibrillation he does have a apple watch and he recorded atrial fibrillation with fast ventricular rate.  It happened when he did not take his metoprolol.  Otherwise doing great.  Asymptomatic, does what he wants no difficulties.  No shortness of breath no palpitation except one episode.  Past Medical History:  Diagnosis Date  . SVT (supraventricular tachycardia) (HCC)     Past Surgical History:  Procedure Laterality Date  . APPENDECTOMY  1993  . HERNIA REPAIR  2005   right 2005 and left 2008  . VASECTOMY  2007    Current Medications: Current Meds  Medication Sig  . cetirizine (ZYRTEC) 10 MG tablet Take 10 mg by mouth daily.  . metoprolol succinate (TOPROL-XL) 50 MG 24 hr tablet TAKE 1 TABLET BY MOUTH DAILY. TAKE WITH OR IMMEDIATELY FOLLOWING A MEAL.KEEP OFFICE APPOINTMENT     Allergies:   Patient has no known allergies.   Social History   Socioeconomic History  . Marital status: Married    Spouse name: Not on file  . Number of children: Not on file  . Years of education: Not on file  . Highest education level: Not on file  Occupational History  . Not on file  Tobacco Use  . Smoking status: Former Research scientist (life sciences)  . Smokeless tobacco: Never Used  Substance and Sexual Activity  . Alcohol use: Yes  . Drug use: Never  . Sexual activity: Not on file  Other Topics Concern  . Not on file    Social History Narrative  . Not on file   Social Determinants of Health   Financial Resource Strain:   . Difficulty of Paying Living Expenses: Not on file  Food Insecurity:   . Worried About Charity fundraiser in the Last Year: Not on file  . Ran Out of Food in the Last Year: Not on file  Transportation Needs:   . Lack of Transportation (Medical): Not on file  . Lack of Transportation (Non-Medical): Not on file  Physical Activity:   . Days of Exercise per Week: Not on file  . Minutes of Exercise per Session: Not on file  Stress:   . Feeling of Stress : Not on file  Social Connections:   . Frequency of Communication with Friends and Family: Not on file  . Frequency of Social Gatherings with Friends and Family: Not on file  . Attends Religious Services: Not on file  . Active Member of Clubs or Organizations: Not on file  . Attends Archivist Meetings: Not on file  . Marital Status: Not on file     Family History: The patient's family history includes Cancer in his sister; Heart attack in his mother; Heart disease in his mother. ROS:   Please see the history of present illness.  All 14 point review of systems negative except as described per history of present illness  EKGs/Labs/Other Studies Reviewed:      Recent Labs: 01/14/2019: ALT 18; BUN 15; Creatinine, Ser 1.08; Hemoglobin 13.9; Platelets 275.0; Potassium 4.6; Sodium 134  Recent Lipid Panel    Component Value Date/Time   CHOL 222 (H) 01/14/2019 1511   TRIG 99.0 01/14/2019 1511   HDL 57.40 01/14/2019 1511   CHOLHDL 4 01/14/2019 1511   VLDL 19.8 01/14/2019 1511   LDLCALC 144 (H) 01/14/2019 1511    Physical Exam:    VS:  BP 112/70   Pulse 64   Ht 6' (1.829 m)   Wt 160 lb (72.6 kg)   SpO2 99%   BMI 21.70 kg/m     Wt Readings from Last 3 Encounters:  07/11/19 160 lb (72.6 kg)  02/03/19 157 lb 12.8 oz (71.6 kg)  01/14/19 157 lb (71.2 kg)     GEN:  Well nourished, well developed in no acute  distress HEENT: Normal NECK: No JVD; No carotid bruits LYMPHATICS: No lymphadenopathy CARDIAC: RRR, no murmurs, no rubs, no gallops RESPIRATORY:  Clear to auscultation without rales, wheezing or rhonchi  ABDOMEN: Soft, non-tender, non-distended MUSCULOSKELETAL:  No edema; No deformity  SKIN: Warm and dry LOWER EXTREMITIES: no swelling NEUROLOGIC:  Alert and oriented x 3 PSYCHIATRIC:  Normal affect   ASSESSMENT:    1. Supraventricular tachycardia (HCC)   2. PAF (paroxysmal atrial fibrillation) (HCC)   3. Palpitations   4. Dyslipidemia   5. Dyspnea on exertion    PLAN:    In order of problems listed above:  1. Paroxysmal atrial fibrillation.  His chads 2 vascular equals 0.  Therefore does not need to anticoagulate.  We will watch you however carefully for increased frequency of atrial fibrillation.  So far it happens once every 6 months or so.  Well continue monitoring.  In the future he will probably require an echocardiogram to reassess his left atrial size. 2. Dyslipidemia his cholesterol is elevated we talked about need to exercise on a regular basis.  We will follow that up.  No need to start any medication at this stage but this is something that we will continue discussing. 3. Dyspnea on exertion denies having any overall doing well from that point review.   Medication Adjustments/Labs and Tests Ordered: Current medicines are reviewed at length with the patient today.  Concerns regarding medicines are outlined above.  Orders Placed This Encounter  Procedures  . EKG 12-Lead   Medication changes: No orders of the defined types were placed in this encounter.   Signed, Georgeanna Lea, MD, [facility] 07/11/2019 2:19 PM    [facility]

## 2019-09-07 ENCOUNTER — Other Ambulatory Visit: Payer: Self-pay | Admitting: Cardiology

## 2020-01-16 ENCOUNTER — Other Ambulatory Visit: Payer: Self-pay

## 2020-01-16 ENCOUNTER — Encounter: Payer: Self-pay | Admitting: Family Medicine

## 2020-01-16 ENCOUNTER — Ambulatory Visit (INDEPENDENT_AMBULATORY_CARE_PROVIDER_SITE_OTHER): Payer: No Typology Code available for payment source | Admitting: Family Medicine

## 2020-01-16 VITALS — BP 102/62 | HR 74 | Temp 97.8°F | Ht 72.0 in | Wt 158.0 lb

## 2020-01-16 DIAGNOSIS — Z0000 Encounter for general adult medical examination without abnormal findings: Secondary | ICD-10-CM

## 2020-01-16 DIAGNOSIS — Z1159 Encounter for screening for other viral diseases: Secondary | ICD-10-CM

## 2020-01-16 LAB — COMPREHENSIVE METABOLIC PANEL
ALT: 18 U/L (ref 0–53)
AST: 17 U/L (ref 0–37)
Albumin: 4.2 g/dL (ref 3.5–5.2)
Alkaline Phosphatase: 68 U/L (ref 39–117)
BUN: 13 mg/dL (ref 6–23)
CO2: 29 mEq/L (ref 19–32)
Calcium: 9.5 mg/dL (ref 8.4–10.5)
Chloride: 102 mEq/L (ref 96–112)
Creatinine, Ser: 1.11 mg/dL (ref 0.40–1.50)
GFR: 67.36 mL/min (ref >60.00)
Glucose, Bld: 108 mg/dL — ABNORMAL HIGH (ref 70–99)
Potassium: 4.8 mEq/L (ref 3.5–5.1)
Sodium: 135 mEq/L (ref 135–145)
Total Bilirubin: 0.6 mg/dL (ref 0.2–1.2)
Total Protein: 7 g/dL (ref 6.0–8.3)

## 2020-01-16 LAB — CBC
HCT: 41.5 % (ref 39.0–52.0)
Hemoglobin: 14 g/dL (ref 13.0–17.0)
MCHC: 33.7 g/dL (ref 30.0–36.0)
MCV: 97.5 fl (ref 78.0–100.0)
Platelets: 239 10*3/uL (ref 150.0–400.0)
RBC: 4.26 Mil/uL (ref 4.22–5.81)
RDW: 12.7 % (ref 11.5–15.5)
WBC: 6.1 10*3/uL (ref 4.0–10.5)

## 2020-01-16 LAB — LIPID PANEL
Cholesterol: 231 mg/dL — ABNORMAL HIGH (ref 0–200)
HDL: 59.7 mg/dL (ref >39.00)
LDL Cholesterol: 158 mg/dL — ABNORMAL HIGH (ref 0–99)
NonHDL: 171.19
Total CHOL/HDL Ratio: 4
Triglycerides: 65 mg/dL (ref 0.0–149.0)
VLDL: 13 mg/dL (ref 0.0–40.0)

## 2020-01-16 NOTE — Patient Instructions (Signed)
Give us 2-3 business days to get the results of your labs back.   Keep the diet clean and stay active.  Let us know if you need anything. 

## 2020-01-16 NOTE — Progress Notes (Signed)
Chief Complaint  Patient presents with   Annual Exam    Well Male Darryl Curry is here for a complete physical.   His last physical was >1 year ago.  Current diet: in general, a "healthy" diet.  Current exercise: walking Weight trend: stable Fatigue out of ordinary? No. Seat belt? Yes.     Health maintenance Shingrix- No Colonoscopy- Yes Tetanus- Yes HIV- Yes Hep C- Yes   Past Medical History:  Diagnosis Date   SVT (supraventricular tachycardia) (HCC)       Past Surgical History:  Procedure Laterality Date   APPENDECTOMY  1993   HERNIA REPAIR  2005   right 2005 and left 2008   VASECTOMY  2007    Medications  Current Outpatient Medications on File Prior to Visit  Medication Sig Dispense Refill   cetirizine (ZYRTEC) 10 MG tablet Take 10 mg by mouth daily.     Ibuprofen 200 MG CAPS Take by mouth.     metoprolol succinate (TOPROL-XL) 50 MG 24 hr tablet TAKE 1 TAB BY MOUTH DAILY. TAKE WITH OR IMMEDIATELY FOLLOWING A MEAL.NEEDS OFFICE VISIT 90 tablet 2   Allergies No Known Allergies  Family History Family History  Problem Relation Age of Onset   Heart disease Mother    Heart attack Mother    Cancer Sister     Review of Systems: Constitutional:  no fevers Eye:  no recent significant change in vision Ear/Nose/Mouth/Throat:  Ears:  no hearing loss Nose/Mouth/Throat:  no complaints of nasal congestion, no sore throat  Cardiovascular:  no chest pain Respiratory:  no shortness of breath Gastrointestinal:  no change in bowel habits GU:  Male: negative for dysuria, frequency Musculoskeletal/Extremities:  no joint pain Integumentary (Skin/Breast):  no abnormal skin lesions reported Neurologic:  no headaches Endocrine: No unexpected weight changes Hematologic/Lymphatic:  no abnormal bleeding  Exam BP 102/62 (BP Location: Left Arm, Patient Position: Sitting, Cuff Size: Normal)    Pulse 74    Temp 97.8 F (36.6 C) (Oral)    Ht 6' (1.829 m)    Wt 158 lb  (71.7 kg)    SpO2 96%    BMI 21.43 kg/m  General:  well developed, well nourished, in no apparent distress Skin:  no significant moles, warts, or growths Head:  no masses, lesions, or tenderness Eyes:  pupils equal and round, sclera anicteric without injection Ears:  canals without lesions, TMs shiny without retraction, no obvious effusion, no erythema Nose:  nares patent, septum midline, mucosa normal Throat/Pharynx:  lips and gingiva without lesion; tongue and uvula midline; non-inflamed pharynx; no exudates or postnasal drainage; interesting finding on floor of mouth as detailed below Neck: neck supple without adenopathy, thyromegaly, or masses Cardiac: RRR, no bruits, no LE edema Lungs:  clear to auscultation, breath sounds equal bilaterally, no respiratory distress Rectal: Deferred Musculoskeletal:  symmetrical muscle groups noted without atrophy or deformity Neuro:  gait normal; deep tendon reflexes normal and symmetric Psych: well oriented with normal range of affect and appropriate judgment/insight   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 male. Counseled on diet and exercise. Counseled on risks and benefits of prostate cancer screening with PSA. The patient agrees to forego testing. Immunizations, labs, and further orders as above. We discussed the covid vaccine which I did encourage him  to get.  Discussed Shingrix, he states he never had chicken pox, which would exempt him from requiring. Did state that official rec is for folks too old to have gotten varicella vaccine as a child are rec'd to get the shot. Declined, which I think is fine.  Pt follows with dental team twice yearly. Notes lesions on mouth have not changed, present for years. Dental team not worried.  Follow up in 1 yr or  prn. The patient voiced understanding and agreement to the plan.  Jilda Roche [place], DO 01/16/20 10:58 AM

## 2020-01-19 ENCOUNTER — Telehealth: Payer: Self-pay | Admitting: Family Medicine

## 2020-01-19 ENCOUNTER — Other Ambulatory Visit: Payer: Self-pay | Admitting: Family Medicine

## 2020-01-19 DIAGNOSIS — E7800 Pure hypercholesterolemia, unspecified: Secondary | ICD-10-CM

## 2020-01-19 LAB — HEPATITIS B SURFACE ANTIBODY, QUANTITATIVE: Hep B S AB Quant (Post): <5 m[IU]/mL — ABNORMAL LOW (ref >=10)

## 2020-01-19 MED ORDER — ROSUVASTATIN CALCIUM 20 MG PO TABS: 20.0000 mg | ORAL_TABLET | Freq: Every day | ORAL | 3 refills | Status: DC

## 2020-01-19 NOTE — Telephone Encounter (Signed)
See result notes. 

## 2020-01-19 NOTE — Telephone Encounter (Signed)
Calling in regards to lab results   Please advise

## 2020-03-08 ENCOUNTER — Other Ambulatory Visit: Payer: No Typology Code available for payment source

## 2020-03-08 NOTE — Addendum Note (Signed)
Addended by: Mervin Kung A on: 03/08/2020 07:43 AM   Modules accepted: Orders

## 2020-06-15 ENCOUNTER — Other Ambulatory Visit: Payer: Self-pay | Admitting: Cardiology

## 2020-07-12 ENCOUNTER — Other Ambulatory Visit: Payer: Self-pay | Admitting: Cardiology

## 2020-07-12 NOTE — Telephone Encounter (Signed)
Rx refill sent to pharmacy. 

## 2020-07-22 ENCOUNTER — Other Ambulatory Visit: Payer: Self-pay | Admitting: Cardiology

## 2020-07-22 NOTE — Telephone Encounter (Signed)
Metoprolol Succinate ER 50 mg tablet # 14 only with message to patient that office visit is needed for future refills. /  2nd attempt

## 2020-08-25 ENCOUNTER — Telehealth: Payer: Self-pay | Admitting: Cardiology

## 2020-08-25 ENCOUNTER — Other Ambulatory Visit: Payer: Self-pay

## 2020-08-25 MED ORDER — METOPROLOL SUCCINATE ER 50 MG PO TB24: 50.0000 mg | ORAL_TABLET | Freq: Every day | ORAL | 0 refills | Status: DC

## 2020-08-25 NOTE — Telephone Encounter (Signed)
Metoprolol approved with instruction must keep his appt for additional refill.

## 2020-08-25 NOTE — Telephone Encounter (Signed)
*  STAT* If patient is at the pharmacy, call can be transferred to refill team.   1. Which medications need to be refilled? (please list name of each medication and dose if known)  metoprolol succinate (TOPROL-XL) 50 MG 24 hr tablet  2. Which pharmacy/location (including street and city if local pharmacy) is medication to be sent to? [id number] - [place], [place] - [address]  3. Do they need a 30 day or 90 day supply? 30   Patient is scheduled for appt 09/17/20

## 2020-09-14 DIAGNOSIS — I471 Supraventricular tachycardia, unspecified: Secondary | ICD-10-CM | POA: Insufficient documentation

## 2020-09-17 ENCOUNTER — Other Ambulatory Visit: Payer: Self-pay

## 2020-09-17 ENCOUNTER — Ambulatory Visit: Payer: BC Managed Care – PPO | Admitting: Cardiology

## 2020-09-17 ENCOUNTER — Encounter: Payer: Self-pay | Admitting: Cardiology

## 2020-09-17 VITALS — BP 136/92 | HR 59 | Ht 72.0 in | Wt 163.0 lb

## 2020-09-17 DIAGNOSIS — I471 Supraventricular tachycardia: Secondary | ICD-10-CM

## 2020-09-17 DIAGNOSIS — R002 Palpitations: Secondary | ICD-10-CM

## 2020-09-17 DIAGNOSIS — E785 Hyperlipidemia, unspecified: Secondary | ICD-10-CM

## 2020-09-17 DIAGNOSIS — R0600 Dyspnea, unspecified: Secondary | ICD-10-CM | POA: Diagnosis not present

## 2020-09-17 DIAGNOSIS — R0609 Other forms of dyspnea: Secondary | ICD-10-CM

## 2020-09-17 DIAGNOSIS — I480 Paroxysmal atrial fibrillation: Secondary | ICD-10-CM | POA: Diagnosis not present

## 2020-09-17 NOTE — Progress Notes (Signed)
Cardiology Office Note:    Date:  09/17/2020   ID:  Darryl Curry, DOB Jun 01, 1961, MRN [id number]  PCP:  Sharlene Dory, DO  Cardiologist:  Gypsy Balsam, MD    Referring MD: Sharlene Dory*   Chief Complaint  Patient presents with  . Follow-up  I am doing fine  History of Present Illness:    Darryl Curry is a 59 y.o. male with past medical history significant for supraventricular tachycardia, paroxysmal atrial fibrillation, chads 2 Vascor equals 0, not anticoagulated, dyslipidemia, dyspnea on exertion. Comes today 2 months of follow-up overall doing well denies have any chest pain tightness squeezing pressure burning chest no palpitations.  Still very active complaints having pains and aches in different portion of his body but overall seems to be doing well.   Past Medical History:  Diagnosis Date  . Carpal tunnel syndrome of right wrist 01/18/2018  . Dyslipidemia 04/03/2018  . Dyspnea on exertion 04/03/2018  . PAF (paroxysmal atrial fibrillation) (HCC) 07/11/2019  . Palpitations 01/18/2018  . Supraventricular tachycardia (HCC) 05/28/2018  . SVT (supraventricular tachycardia) (HCC)     Past Surgical History:  Procedure Laterality Date  . APPENDECTOMY  1993  . HERNIA REPAIR  2005   right 2005 and left 2008  . VASECTOMY  2007    Current Medications: Current Meds  Medication Sig  . cetirizine (ZYRTEC) 10 MG tablet Take 10 mg by mouth daily.  . Ibuprofen 200 MG CAPS Take 200 mg by mouth as needed (pain).  . metoprolol succinate (TOPROL-XL) 50 MG 24 hr tablet Take 1 tablet (50 mg total) by mouth daily. Patient must keep schedule appt for continuation of refills.  . rosuvastatin (CRESTOR) 20 MG tablet Take 1 tablet (20 mg total) by mouth daily.     Allergies:   Patient has no known allergies.   Social History   Socioeconomic History  . Marital status: Married    Spouse name: Not on file  . Number of children: Not on file  . Years of education: Not  on file  . Highest education level: Not on file  Occupational History  . Not on file  Tobacco Use  . Smoking status: Former Games developer  . Smokeless tobacco: Never Used  Vaping Use  . Vaping Use: Never used  Substance and Sexual Activity  . Alcohol use: Yes  . Drug use: Never  . Sexual activity: Not on file  Other Topics Concern  . Not on file  Social History Narrative  . Not on file   Social Determinants of Health   Financial Resource Strain: Not on file  Food Insecurity: Not on file  Transportation Needs: Not on file  Physical Activity: Not on file  Stress: Not on file  Social Connections: Not on file     Family History: The patient's family history includes Cancer in his sister; Heart attack in his mother; Heart disease in his mother. ROS:   Please see the history of present illness.    All 14 point review of systems negative except as described per history of present illness  EKGs/Labs/Other Studies Reviewed:      Recent Labs: 01/16/2020: ALT 18; BUN 13; Creatinine, Ser 1.11; Hemoglobin 14.0; Platelets 239.0; Potassium 4.8; Sodium 135  Recent Lipid Panel    Component Value Date/Time   CHOL 231 (H) 01/16/2020 1023   TRIG 65.0 01/16/2020 1023   HDL 59.70 01/16/2020 1023   CHOLHDL 4 01/16/2020 1023   VLDL 13.0 01/16/2020 1023  LDLCALC 158 (H) 01/16/2020 1023    Physical Exam:    VS:  BP (!) 136/92 (BP Location: Right Arm, Patient Position: Sitting)   Pulse (!) 59   Ht 6' (1.829 m)   Wt 163 lb (73.9 kg)   SpO2 96%   BMI 22.11 kg/m     Wt Readings from Last 3 Encounters:  09/17/20 163 lb (73.9 kg)  01/16/20 158 lb (71.7 kg)  07/11/19 160 lb (72.6 kg)     GEN:  Well nourished, well developed in no acute distress HEENT: Normal NECK: No JVD; No carotid bruits LYMPHATICS: No lymphadenopathy CARDIAC: RRR, no murmurs, no rubs, no gallops RESPIRATORY:  Clear to auscultation without rales, wheezing or rhonchi  ABDOMEN: Soft, non-tender,  non-distended MUSCULOSKELETAL:  No edema; No deformity  SKIN: Warm and dry LOWER EXTREMITIES: no swelling NEUROLOGIC:  Alert and oriented x 3 PSYCHIATRIC:  Normal affect   ASSESSMENT:    1. SVT (supraventricular tachycardia) (HCC)   2. PAF (paroxysmal atrial fibrillation) (HCC)   3. Palpitations   4. Dyspnea on exertion   5. Dyslipidemia    PLAN:    In order of problems listed above:  1. Paroxysmal atrial fibrillation denies having episodes, chads 2 vascular equals 0.  Not anticoagulated. 2. Supraventricular tachycardia denies having any episodes as long as he takes his metoprolol at night this time he does. 3. Dyslipidemia I did review his K PN which show me his LDL of 158 HDL of 59 this is from summer of last year he was put on Crestor however I do not see any cholesterol thereafter.  I will check his fasting profile today with AST ALT. 4. Dyspnea on exertion denies having any.  Overall he is doing well and I see him back in 1 year   Medication Adjustments/Labs and Tests Ordered: Current medicines are reviewed at length with the patient today.  Concerns regarding medicines are outlined above.  No orders of the defined types were placed in this encounter.  Medication changes: No orders of the defined types were placed in this encounter.   Signed, Georgeanna Lea, MD, [facility] 09/17/2020 9:58 AM    [facility]

## 2020-09-17 NOTE — Addendum Note (Signed)
Addended by: Hazle Quant on: 09/17/2020 10:09 AM   Modules accepted: Orders

## 2020-09-17 NOTE — Patient Instructions (Signed)
Medication Instructions:  Your physician recommends that you continue on your current medications as directed. Please refer to the Current Medication list given to you today.  *If you need a refill on your cardiac medications before your next appointment, please call your pharmacy*   Lab Work: Your physician recommends that you return for lab work today: lipid, lft  If you have labs (blood work) drawn today and your tests are completely normal, you will receive your results only by: MyChart Message (if you have MyChart) OR A paper copy in the mail If you have any lab test that is abnormal or we need to change your treatment, we will call you to review the results.   Testing/Procedures: None   Follow-Up: At [facility], you and your health needs are our priority.  As part of our continuing mission to provide you with exceptional heart care, we have created designated Provider Care Teams.  These Care Teams include your primary Cardiologist (physician) and Advanced Practice Providers (APPs -  Physician Assistants and Nurse Practitioners) who all work together to provide you with the care you need, when you need it.  We recommend signing up for the patient portal called "MyChart".  Sign up information is provided on this After Visit Summary.  MyChart is used to connect with patients for Virtual Visits (Telemedicine).  Patients are able to view lab/test results, encounter notes, upcoming appointments, etc.  Non-urgent messages can be sent to your provider as well.   To learn more about what you can do with MyChart, go to [email or web address].    Your next appointment:   1 year(s)  The format for your next appointment:   In Person  Provider:   Robert Krasowski, MD    Other Instructions    

## 2020-09-18 ENCOUNTER — Other Ambulatory Visit: Payer: Self-pay | Admitting: Cardiology

## 2020-09-18 LAB — HEPATIC FUNCTION PANEL
ALT: 47 IU/L — ABNORMAL HIGH (ref 0–44)
AST: 34 IU/L (ref 0–40)
Albumin: 4.9 g/dL — ABNORMAL HIGH (ref 3.8–4.8)
Alkaline Phosphatase: 96 IU/L (ref 44–121)
Bilirubin Total: 0.5 mg/dL (ref 0.0–1.2)
Bilirubin, Direct: 0.13 mg/dL (ref 0.00–0.40)
Total Protein: 6.8 g/dL (ref 6.0–8.5)

## 2020-09-18 LAB — LIPID PANEL
Chol/HDL Ratio: 2.9 ratio (ref 0.0–5.0)
Cholesterol, Total: 173 mg/dL (ref 100–199)
HDL: 60 mg/dL (ref >39)
LDL Chol Calc (NIH): 93 mg/dL (ref 0–99)
Triglycerides: 113 mg/dL (ref 0–149)
VLDL Cholesterol Cal: 20 mg/dL (ref 5–40)

## 2020-09-20 NOTE — Telephone Encounter (Signed)
Rx refill sent to pharmacy. 

## 2020-09-22 ENCOUNTER — Telehealth: Payer: Self-pay

## 2020-09-22 NOTE — Telephone Encounter (Signed)
-----   Message from Georgeanna Lea, MD sent at 09/22/2020  1:08 PM EDT ----- [place] acceptable, continue present management

## 2020-09-22 NOTE — Telephone Encounter (Signed)
Patient notified of results.

## 2021-01-21 ENCOUNTER — Encounter: Payer: No Typology Code available for payment source | Admitting: Family Medicine

## 2021-02-01 ENCOUNTER — Other Ambulatory Visit: Payer: Self-pay

## 2021-02-01 ENCOUNTER — Encounter: Payer: Self-pay | Admitting: Family Medicine

## 2021-02-01 ENCOUNTER — Ambulatory Visit (INDEPENDENT_AMBULATORY_CARE_PROVIDER_SITE_OTHER): Payer: BC Managed Care – PPO | Admitting: Family Medicine

## 2021-02-01 VITALS — BP 115/70 | HR 53 | Temp 98.0°F | Ht 72.0 in | Wt 164.2 lb

## 2021-02-01 DIAGNOSIS — Z0000 Encounter for general adult medical examination without abnormal findings: Secondary | ICD-10-CM

## 2021-02-01 MED ORDER — ROSUVASTATIN CALCIUM 20 MG PO TABS: 20.0000 mg | ORAL_TABLET | Freq: Every day | ORAL | 3 refills | Status: DC

## 2021-02-01 MED ORDER — SILDENAFIL CITRATE 100 MG PO TABS: 50.0000 mg | ORAL_TABLET | Freq: Every day | ORAL | 2 refills | Status: DC | PRN

## 2021-02-01 NOTE — Patient Instructions (Addendum)
Give us 2-3 business days to get the results of your labs back.   Keep the diet clean and stay active.  I recommend getting the flu shot in mid October. This suggestion would change if the CDC comes out with a different recommendation.   Let us know if you need anything. 

## 2021-02-01 NOTE — Progress Notes (Signed)
Chief Complaint  Patient presents with   Annual Exam    Well Male Darryl Curry is here for a complete physical.   His last physical was >1 year ago.  Current diet: in general, a "healthy" diet.  Current exercise: walking Weight trend: stable Fatigue out of ordinary? No. Seat belt? Yes.    Health maintenance Shingrix- No; never had chicken pox Colonoscopy- Yes Tetanus- Yes HIV- Yes Hep C- Yes   Past Medical History:  Diagnosis Date   Carpal tunnel syndrome of right wrist 01/18/2018   Dyslipidemia 04/03/2018   Dyspnea on exertion 04/03/2018   PAF (paroxysmal atrial fibrillation) (HCC) 07/11/2019   Palpitations 01/18/2018   Supraventricular tachycardia (HCC) 05/28/2018   SV[facility]supraventricular tachycardia) [facility])     Past Surgical History:  Procedure Laterality Date   APPENDECTOMY  1993   HERNIA REPAIR  2005   right 2005 and left 2008   VASECTOMY  2007    Medications  Current Outpatient Medications on File Prior to Visit  Medication Sig Dispense Refill   cetirizine (ZYRTEC) 10 MG tablet Take 10 mg by mouth daily.     Ibuprofen 200 MG CAPS Take 200 mg by mouth as needed (pain).     metoprolol succinate (TOPROL-XL) 50 MG 24 hr tablet TAKE 1 TABLET (50 MG TOTAL) BY MOUTH DAILY. PATIENT MUST KEEP SCHEDULE APPT FOR CONTINUATION OF REFILLS. 30 tablet 5   rosuvastatin (CRESTOR) 20 MG tablet Take 1 tablet (20 mg total) by mouth daily. 90 tablet 3    Allergies No Known Allergies  Family History Family History  Problem Relation Age of Onset   Heart disease Mother    Heart attack Mother    Cancer Sister     Review of Systems: Constitutional:  no fevers Eye:  no recent significant change in vision Ear/Nose/Mouth/Throat:  Ears:  no hearing loss Nose/Mouth/Throat:  no complaints of nasal congestion, no sore throat Cardiovascular:  no chest pain Respiratory:  no shortness of breath Gastrointestinal:  no change in bowel habits GU:  Male: negative for dysuria,  frequency Musculoskeletal/Extremities:  no new joint pain Integumentary (Skin/Breast):  no abnormal skin lesions reported Neurologic:  no headaches Endocrine: No unexpected weight changes Hematologic/Lymphatic:  no abnormal bleeding  Exam BP 115/70   Pulse (!) 53   Temp 98 F (36.7 C) (Oral)   Ht 6' (1.829 m)   Wt 164 lb 4 oz (74.5 kg)   SpO2 97%   BMI 22.28 kg/m  General:  well developed, well nourished, in no apparent distress Skin:  no significant moles, warts, or growths Head:  no masses, lesions, or tenderness Eyes:  pupils equal and round, sclera anicteric without injection Ears:  canals without lesions, TMs shiny without retraction, no obvious effusion, no erythema Nose:  nares patent, septum midline, mucosa normal Throat/Pharynx:  lips and gingiva without lesion; tongue and uvula midline; non-inflamed pharynx; no exudates or postnasal drainage Neck: neck supple without adenopathy, thyromegaly, or masses Cardiac: Reg rhythm, bradycardic, no bruits, no LE edema Lungs:  clear to auscultation, breath sounds equal bilaterally, no respiratory distress Abdomen: BS+, soft, non-tender, non-distended, no masses or organomegaly noted Rectal: Deferred Musculoskeletal:  symmetrical muscle groups noted without atrophy or deformity Neuro:  gait normal; deep tendon reflexes normal and symmetric Psych: well oriented with normal range of affect and appropriate judgment/insight  Assessment and Plan  Well adult exam - Plan: CBC, Comprehensive metabolic panel, Lipid panel   Well 60 y.o. male. Counseled on diet and exercise.  Counseled on risks and benefits of prostate cancer screening with PSA. The patient agrees to forego testing. Immunizations, labs, and further orders as above. Follow up in 6 mo or prn. The patient voiced understanding and agreement to the plan.  Jilda Roche [place], DO 02/01/21 2:56 PM

## 2021-02-02 LAB — COMPREHENSIVE METABOLIC PANEL
ALT: 35 U/L (ref 0–53)
AST: 32 U/L (ref 0–37)
Albumin: 4.3 g/dL (ref 3.5–5.2)
Alkaline Phosphatase: 75 U/L (ref 39–117)
BUN: 10 mg/dL (ref 6–23)
CO2: 27 mEq/L (ref 19–32)
Calcium: 9.7 mg/dL (ref 8.4–10.5)
Chloride: 99 mEq/L (ref 96–112)
Creatinine, Ser: 1.06 mg/dL (ref 0.40–1.50)
GFR: 75.49 mL/min (ref >60.00)
Glucose, Bld: 95 mg/dL (ref 70–99)
Potassium: 5 mEq/L (ref 3.5–5.1)
Sodium: 135 mEq/L (ref 135–145)
Total Bilirubin: 0.7 mg/dL (ref 0.2–1.2)
Total Protein: 7.1 g/dL (ref 6.0–8.3)

## 2021-02-02 LAB — LIPID PANEL
Cholesterol: 175 mg/dL (ref 0–200)
HDL: 72.6 mg/dL (ref >39.00)
LDL Cholesterol: 89 mg/dL (ref 0–99)
NonHDL: 102.27
Total CHOL/HDL Ratio: 2
Triglycerides: 64 mg/dL (ref 0.0–149.0)
VLDL: 12.8 mg/dL (ref 0.0–40.0)

## 2021-02-02 LAB — CBC
HCT: 42 % (ref 39.0–52.0)
Hemoglobin: 14.2 g/dL (ref 13.0–17.0)
MCHC: 33.9 g/dL (ref 30.0–36.0)
MCV: 96.1 fl (ref 78.0–100.0)
Platelets: 270 10*3/uL (ref 150.0–400.0)
RBC: 4.36 Mil/uL (ref 4.22–5.81)
RDW: 12.9 % (ref 11.5–15.5)
WBC: 8.2 10*3/uL (ref 4.0–10.5)

## 2021-03-19 ENCOUNTER — Other Ambulatory Visit: Payer: Self-pay | Admitting: Cardiology

## 2021-05-09 ENCOUNTER — Encounter: Payer: Self-pay | Admitting: Family Medicine

## 2021-05-09 NOTE — Telephone Encounter (Signed)
Patient came in and dropped off form  Can be emailed back to him at  [email or web address] Or can call patient to pick it up  Placed in wendling bin up front

## 2021-05-10 ENCOUNTER — Ambulatory Visit: Payer: BC Managed Care – PPO | Admitting: Family Medicine

## 2021-08-08 ENCOUNTER — Ambulatory Visit: Payer: BC Managed Care – PPO | Admitting: Family Medicine

## 2021-08-10 ENCOUNTER — Other Ambulatory Visit (INDEPENDENT_AMBULATORY_CARE_PROVIDER_SITE_OTHER): Payer: 59

## 2021-08-10 ENCOUNTER — Ambulatory Visit: Payer: 59 | Admitting: Family Medicine

## 2021-08-10 ENCOUNTER — Encounter: Payer: Self-pay | Admitting: Family Medicine

## 2021-08-10 VITALS — BP 130/72 | HR 53 | Temp 98.1°F | Resp 16 | Ht 72.0 in | Wt 164.0 lb

## 2021-08-10 DIAGNOSIS — E785 Hyperlipidemia, unspecified: Secondary | ICD-10-CM | POA: Diagnosis not present

## 2021-08-10 DIAGNOSIS — I471 Supraventricular tachycardia: Secondary | ICD-10-CM | POA: Diagnosis not present

## 2021-08-10 LAB — COMPREHENSIVE METABOLIC PANEL
ALT: 33 U/L (ref 0–53)
AST: 28 U/L (ref 0–37)
Albumin: 4.4 g/dL (ref 3.5–5.2)
Alkaline Phosphatase: 82 U/L (ref 39–117)
BUN: 13 mg/dL (ref 6–23)
CO2: 28 mEq/L (ref 19–32)
Calcium: 9.7 mg/dL (ref 8.4–10.5)
Chloride: 100 mEq/L (ref 96–112)
Creatinine, Ser: 1.07 mg/dL (ref 0.40–1.50)
GFR: 74.38 mL/min (ref >60.00)
Glucose, Bld: 120 mg/dL — ABNORMAL HIGH (ref 70–99)
Potassium: 4.9 mEq/L (ref 3.5–5.1)
Sodium: 134 mEq/L — ABNORMAL LOW (ref 135–145)
Total Bilirubin: 0.9 mg/dL (ref 0.2–1.2)
Total Protein: 7 g/dL (ref 6.0–8.3)

## 2021-08-10 LAB — LIPID PANEL
Cholesterol: 164 mg/dL (ref 0–200)
HDL: 66.3 mg/dL (ref >39.00)
LDL Cholesterol: 83 mg/dL (ref 0–99)
NonHDL: 97.8
Total CHOL/HDL Ratio: 2
Triglycerides: 72 mg/dL (ref 0.0–149.0)
VLDL: 14.4 mg/dL (ref 0.0–40.0)

## 2021-08-10 MED ORDER — TADALAFIL 20 MG PO TABS: 10.0000 mg | ORAL_TABLET | ORAL | 3 refills | Status: DC | PRN

## 2021-08-10 MED ORDER — ROSUVASTATIN CALCIUM 20 MG PO TABS: 20.0000 mg | ORAL_TABLET | Freq: Every day | ORAL | 3 refills | Status: DC

## 2021-08-10 NOTE — Addendum Note (Signed)
Addended by: Kelle Darting A on: 08/10/2021 03:33 PM ? ? Modules accepted: Orders ? ?

## 2021-08-10 NOTE — Patient Instructions (Addendum)
Give us 2-3 business days to get the results of your labs back.   Keep the diet clean and stay active.  Use GoodRx for the tadalafil.   Let us know if you need anything. 

## 2021-08-10 NOTE — Progress Notes (Signed)
Chief Complaint  ?Patient presents with  ? Follow-up  ?  Follow up on  Medication   ? ? ?Subjective: ?Dyslipidemia ?Patient presents for Hyperlipidemia follow up. ?Currently taking Crestor 20 mg/d and compliance with treatment thus far has been good. ?He denies myalgias. ?He is adhering to a healthy diet. ?Exercise: walking ?No Cp or SOB.  ?The patient is not known to have coexisting coronary artery disease. ? ?Past Medical History:  ?Diagnosis Date  ? Carpal tunnel syndrome of right wrist 01/18/2018  ? Dyslipidemia 04/03/2018  ? Dyspnea on exertion 04/03/2018  ? PAF (paroxysmal atrial fibrillation) ([place]) 07/11/2019  ? Palpitations 01/18/2018  ? Supraventricular tachycardia ([place]) 05/28/2018  ? SVT (supraventricular tachycardia) ([place])   ? ? ?Objective: ?BP 130/72 (BP Location: Right Arm, Patient Position: Sitting, Cuff Size: Normal)   Pulse (!) 53   Temp 98.1 ?F (36.7 ?C) (Oral)   Resp 16   Ht 6' (1.829 m)   Wt 164 lb (74.4 kg)   SpO2 98%   BMI 22.24 kg/m?  ?General: Awake, appears stated age ?HEENT: MMM ?Heart: RRR, no LE edema, no bruits ?Lungs: CTAB, no rales, wheezes or rhonchi. No accessory muscle use ?Psych: Age appropriate judgment and insight, normal affect and mood ? ?Assessment and Plan: ?Dyslipidemia - Plan: Comprehensive metabolic panel, Lipid panel ? ?SVT (supraventricular tachycardia) ([place]), Chronic ? ?Chronic, stable. Cont Crestor 20 mg/d. Ck labs. Counseled on diet/exercise.  ?F/u in 6 mo or prn. ?The patient voiced understanding and agreement to the plan. ? ?Shelda Pal, DO ?08/10/21  ?12:04 PM ? ? ? ? ?

## 2021-09-22 ENCOUNTER — Other Ambulatory Visit: Payer: Self-pay | Admitting: Cardiology

## 2022-01-13 ENCOUNTER — Other Ambulatory Visit: Payer: Self-pay | Admitting: Cardiology

## 2022-01-13 ENCOUNTER — Telehealth: Payer: Self-pay | Admitting: Cardiology

## 2022-01-13 ENCOUNTER — Other Ambulatory Visit: Payer: Self-pay

## 2022-01-13 MED ORDER — METOPROLOL SUCCINATE ER 50 MG PO TB24
ORAL_TABLET | ORAL | 0 refills | Status: DC
Start: 2022-01-13 — End: 2022-01-13

## 2022-01-13 MED ORDER — METOPROLOL SUCCINATE ER 50 MG PO TB24: ORAL_TABLET | ORAL | 1 refills | Status: DC

## 2022-01-13 NOTE — Telephone Encounter (Signed)
*  STAT* If patient is at the pharmacy, call can be transferred to refill team.   1. Which medications need to be refilled? (please list name of each medication and dose if known) metoprolol succinate (TOPROL-XL) 50 MG 24 hr tablet  2. Which pharmacy/location (including street and city if local pharmacy) is medication to be sent to? [id number] - [place], [place] - [address]  3. Do they need a 30 day or 90 day supply? 90   Pt completely out, pt made a f/u for 05/17/22 with Dr. Bing Matter

## 2022-02-14 ENCOUNTER — Ambulatory Visit (INDEPENDENT_AMBULATORY_CARE_PROVIDER_SITE_OTHER): Payer: 59 | Admitting: Family Medicine

## 2022-02-14 ENCOUNTER — Other Ambulatory Visit: Payer: Self-pay | Admitting: Family Medicine

## 2022-02-14 ENCOUNTER — Encounter: Payer: Self-pay | Admitting: Family Medicine

## 2022-02-14 VITALS — BP 120/82 | HR 52 | Temp 97.5°F | Ht 72.0 in | Wt 163.5 lb

## 2022-02-14 DIAGNOSIS — Z0000 Encounter for general adult medical examination without abnormal findings: Secondary | ICD-10-CM | POA: Diagnosis not present

## 2022-02-14 DIAGNOSIS — R739 Hyperglycemia, unspecified: Secondary | ICD-10-CM

## 2022-02-14 LAB — COMPREHENSIVE METABOLIC PANEL
ALT: 28 U/L (ref 0–53)
AST: 23 U/L (ref 0–37)
Albumin: 4.2 g/dL (ref 3.5–5.2)
Alkaline Phosphatase: 72 U/L (ref 39–117)
BUN: 12 mg/dL (ref 6–23)
CO2: 28 mEq/L (ref 19–32)
Calcium: 9.6 mg/dL (ref 8.4–10.5)
Chloride: 102 mEq/L (ref 96–112)
Creatinine, Ser: 1.14 mg/dL (ref 0.40–1.50)
GFR: 68.68 mL/min (ref >60.00)
Glucose, Bld: 108 mg/dL — ABNORMAL HIGH (ref 70–99)
Potassium: 4.7 mEq/L (ref 3.5–5.1)
Sodium: 139 mEq/L (ref 135–145)
Total Bilirubin: 0.8 mg/dL (ref 0.2–1.2)
Total Protein: 7 g/dL (ref 6.0–8.3)

## 2022-02-14 LAB — CBC
HCT: 42.7 % (ref 39.0–52.0)
Hemoglobin: 14.1 g/dL (ref 13.0–17.0)
MCHC: 32.9 g/dL (ref 30.0–36.0)
MCV: 95.4 fl (ref 78.0–100.0)
Platelets: 227 10*3/uL (ref 150.0–400.0)
RBC: 4.48 Mil/uL (ref 4.22–5.81)
RDW: 12.8 % (ref 11.5–15.5)
WBC: 7 10*3/uL (ref 4.0–10.5)

## 2022-02-14 LAB — LIPID PANEL
Cholesterol: 152 mg/dL (ref 0–200)
HDL: 64.8 mg/dL (ref >39.00)
LDL Cholesterol: 71 mg/dL (ref 0–99)
NonHDL: 86.72
Total CHOL/HDL Ratio: 2
Triglycerides: 81 mg/dL (ref 0.0–149.0)
VLDL: 16.2 mg/dL (ref 0.0–40.0)

## 2022-02-14 MED ORDER — FLUTICASONE PROPIONATE 50 MCG/ACT NA SUSP: 2.0000 | Freq: Every day | NASAL | 2 refills | Status: DC

## 2022-02-14 MED ORDER — SILDENAFIL CITRATE 100 MG PO TABS: 50.0000 mg | ORAL_TABLET | Freq: Every day | ORAL | 2 refills | Status: DC | PRN

## 2022-02-14 NOTE — Patient Instructions (Signed)
Give [place] 2-3 business days to get the results of your labs back.   Keep the diet clean and stay active.  Please get me a copy of your advanced directive form at your convenience.   Continue Zyrtec.   Flonase (fluticasone); nasal spray that is over the counter. 2 sprays each nostril, once daily. Aim towards the same side eye when you spray.  Let us know if you need anything.

## 2022-02-14 NOTE — Progress Notes (Addendum)
Chief Complaint  Patient presents with   Annual Exam    Well Male Darryl Curry is here for a complete physical.   His last physical was >1 year ago.  Current diet: in general, a "healthy" diet.  Current exercise: walking Weight trend: stable Fatigue out of ordinary? No. Seat belt? Yes.   Advanced directive? Yes  Health maintenance Shingrix- No- never had chicken Colonoscopy- Yes Tetanus- Yes HIV- Yes Hep C- Yes   Past Medical History:  Diagnosis Date   Carpal tunnel syndrome of right wrist 01/18/2018   Dyslipidemia 04/03/2018   Dyspnea on exertion 04/03/2018   PAF (paroxysmal atrial fibrillation) (HCC) 07/11/2019   Palpitations 01/18/2018   Supraventricular tachycardia (HCC) 05/28/2018   SVT [facility]sup[facility]ventricular tachycardia) [facility])     Past Surgical History:  Procedure Laterality Date   APPENDECTOMY  1993   HERNIA REPAIR  2005   right 2005 and left 2008   VASECTOMY  2007    Medications  Current Outpatient Medications on File Prior to Visit  Medication Sig Dispense Refill   cetirizine (ZYRTEC) 10 MG tablet Take 10 mg by mouth daily.     Ibuprofen 200 MG CAPS Take 200 mg by mouth as needed (pain).     metoprolol succinate (TOPROL-XL) 50 MG 24 hr tablet TAKE ONE TABLET BY MOUTH DAILY. PATIENT MUST KEEP SCHEDULE APPT FOT CONTINUATION OF REFIL 90 tablet 1   rosuvastatin (CRESTOR) 20 MG tablet Take 1 tablet (20 mg total) by mouth daily. 90 tablet 3   Allergies No Known Allergies  Family History Family History  Problem Relation Age of Onset   Heart disease Mother    Heart attack Mother    Cancer Sister     Review of Systems: Constitutional:  no fevers Eye:  no recent significant change in vision Ear/Nose/Mouth/Throat:  Ears:  no hearing loss Nose/Mouth/Throat:  no complaints of nasal congestion, no sore throat Cardiovascular:  no chest pain Respiratory:  no shortness of breath Gastrointestinal:  no change in bowel habits GU:  Male: negative for dysuria,  frequency Musculoskeletal/Extremities:  no joint pain Integumentary (Skin/Breast):  no abnormal skin lesions reported Neurologic:  no headaches Endocrine: No unexpected weight changes Hematologic/Lymphatic:  no abnormal bleeding  Exam BP 120/82   Pulse (!) 52   Temp (!) 97.5 F (36.4 C) (Oral)   Ht 6' (1.829 m)   Wt 163 lb 8 oz (74.2 kg)   SpO2 96%   BMI 22.17 kg/m  General:  well developed, well nourished, in no apparent distress Skin:  no significant moles, warts, or growths Head:  no masses, lesions, or tenderness Eyes:  pupils equal and round, sclera anicteric without injection Ears:  canals without lesions, TMs shiny without retraction, no obvious effusion, no erythema Nose:  nares patent, septum deviated to L, mucosa normal Throat/Pharynx:  lips and gingiva without lesion; tongue and uvula midline; non-inflamed pharynx; no exudates or postnasal drainage Neck: neck supple without adenopathy, thyromegaly, or masses Cardiac: RRR, no bruits, no LE edema Lungs:  clear to auscultation, breath sounds equal bilaterally, no respiratory distress Abdomen: BS+, soft, non-tender, non-distended, no masses or organomegaly noted Rectal: Deferred Musculoskeletal:  symmetrical muscle groups noted without atrophy or deformity Neuro:  gait normal; deep tendon reflexes normal and symmetric Psych: well oriented with normal range of affect and appropriate judgment/insight  Assessment and Plan  Well adult exam - Plan: CBC, Comprehensive metabolic panel, Lipid panel   Well 61 y.o. male. Counseled on diet and exercise. Counseled on  risks and benefits of prostate cancer screening with PSA. The patient agrees to forego testing. Advanced directive form requested today.  Immunizations, labs, and further orders as above. Follow up in 6 mo or prn. The patient voiced understanding and agreement to the plan.  Jilda Roche [place], DO 02/14/22 11:01 AM

## 2022-02-15 ENCOUNTER — Other Ambulatory Visit (INDEPENDENT_AMBULATORY_CARE_PROVIDER_SITE_OTHER): Payer: 59

## 2022-02-15 DIAGNOSIS — R739 Hyperglycemia, unspecified: Secondary | ICD-10-CM | POA: Diagnosis not present

## 2022-02-15 LAB — HEMOGLOBIN A1C: Hgb A1c MFr Bld: 6 % (ref 4.6–6.5)

## 2022-05-17 ENCOUNTER — Ambulatory Visit: Payer: 59 | Attending: Cardiology | Admitting: Cardiology

## 2022-05-17 ENCOUNTER — Encounter: Payer: Self-pay | Admitting: Cardiology

## 2022-05-17 VITALS — BP 106/74 | HR 64 | Ht 72.0 in | Wt 165.0 lb

## 2022-05-17 DIAGNOSIS — I480 Paroxysmal atrial fibrillation: Secondary | ICD-10-CM

## 2022-05-17 DIAGNOSIS — E785 Hyperlipidemia, unspecified: Secondary | ICD-10-CM | POA: Diagnosis not present

## 2022-05-17 DIAGNOSIS — I471 Supraventricular tachycardia, unspecified: Secondary | ICD-10-CM | POA: Diagnosis not present

## 2022-05-17 DIAGNOSIS — R002 Palpitations: Secondary | ICD-10-CM

## 2022-05-17 NOTE — Progress Notes (Signed)
Cardiology Office Note:    Date:  05/17/2022   ID:  ILDEFONSO KEANEY, DOB 06-19-1960, MRN [id number]  PCP:  Sharlene Dory, DO  Cardiologist:  Gypsy Balsam, MD    Referring MD: Sharlene Dory*   Chief Complaint  Patient presents with   Follow-up  Doing well  History of Present Illness:    Darryl Curry is a 61 y.o. male past medical history significant for supraventricular tachycardia, episode of atrial fibrillation, CHA2DS2-VASc of 0, not anticoagulated, dyslipidemia. He is in my office today for follow-up.  Overall doing very well.  He denies of any chest pain tightness squeezing pressure burning chest no palpitation dizziness swelling of lower extremities  Past Medical History:  Diagnosis Date   Carpal tunnel syndrome of right wrist 01/18/2018   Dyslipidemia 04/03/2018   Dyspnea on exertion 04/03/2018   PAF (paroxysmal atrial fibrillation) (HCC) 07/11/2019   Palpitations 01/18/2018   Supraventricular tachycardia 05/28/2018   SVT (supraventricular tachycardia)     Past Surgical History:  Procedure Laterality Date   APPENDECTOMY  1993   HERNIA REPAIR  2005   right 2005 and left 2008   VASECTOMY  2007    Current Medications: Current Meds  Medication Sig   cetirizine (ZYRTEC) 10 MG tablet Take 10 mg by mouth daily.   fluticasone (FLONASE) 50 MCG/ACT nasal spray Place 2 sprays into both nostrils daily.   Ibuprofen 200 MG CAPS Take 200 mg by mouth as needed (pain).   metoprolol succinate (TOPROL-XL) 50 MG 24 hr tablet TAKE ONE TABLET BY MOUTH DAILY. PATIENT MUST KEEP SCHEDULE APPT FOT CONTINUATION OF REFIL (Patient taking differently: Take 50 mg by mouth daily. TAKE ONE TABLET BY MOUTH DAILY. PATIENT MUST KEEP SCHEDULE APPT FOT CONTINUATION OF REFIL)   rosuvastatin (CRESTOR) 20 MG tablet Take 1 tablet (20 mg total) by mouth daily.   sildenafil (VIAGRA) 100 MG tablet Take 0.5-1 tablets (50-100 mg total) by mouth daily as needed for erectile dysfunction.      Allergies:   Patient has no known allergies.   Social History   Socioeconomic History   Marital status: Married    Spouse name: Not on file   Number of children: Not on file   Years of education: Not on file   Highest education level: Not on file  Occupational History   Not on file  Tobacco Use   Smoking status: Former   Smokeless tobacco: Never  Vaping Use   Vaping Use: Never used  Substance and Sexual Activity   Alcohol use: Yes   Drug use: Never   Sexual activity: Not on file  Other Topics Concern   Not on file  Social History Narrative   Not on file   Social Determinants of Health   Financial Resource Strain: Not on file  Food Insecurity: Not on file  Transportation Needs: Not on file  Physical Activity: Not on file  Stress: Not on file  Social Connections: Not on file     Family History: The patient's family history includes Cancer in his sister; Heart attack in his mother; Heart disease in his mother. ROS:   Please see the history of present illness.    All 14 point review of systems negative except as described per history of present illness  EKGs/Labs/Other Studies Reviewed:      Recent Labs: 02/14/2022: ALT 28; BUN 12; Creatinine, Ser 1.14; Hemoglobin 14.1; Platelets 227.0; Potassium 4.7; Sodium 139  Recent Lipid Panel    Component Value  Date/Time   CHOL 152 02/14/2022 1102   CHOL 173 09/17/2020 1016   TRIG 81.0 02/14/2022 1102   HDL 64.80 02/14/2022 1102   HDL 60 09/17/2020 1016   CHOLHDL 2 02/14/2022 1102   VLDL 16.2 02/14/2022 1102   LDLCALC 71 02/14/2022 1102   LDLCALC 93 09/17/2020 1016    Physical Exam:    VS:  BP 106/74 (BP Location: Left Arm, Patient Position: Sitting)   Pulse 64   Ht 6' (1.829 m)   Wt 165 lb (74.8 kg)   SpO2 96%   BMI 22.38 kg/m     Wt Readings from Last 3 Encounters:  05/17/22 165 lb (74.8 kg)  02/14/22 163 lb 8 oz (74.2 kg)  08/10/21 164 lb (74.4 kg)     GEN:  Well nourished, well developed in no  acute distress HEENT: Normal NECK: No JVD; No carotid bruits LYMPHATICS: No lymphadenopathy CARDIAC: RRR, no murmurs, no rubs, no gallops RESPIRATORY:  Clear to auscultation without rales, wheezing or rhonchi  ABDOMEN: Soft, non-tender, non-distended MUSCULOSKELETAL:  No edema; No deformity  SKIN: Warm and dry LOWER EXTREMITIES: no swelling NEUROLOGIC:  Alert and oriented x 3 PSYCHIATRIC:  Normal affect   ASSESSMENT:    1. SVT (supraventricular tachycardia)   2. PAF (paroxysmal atrial fibrillation) (HCC)   3. Dyslipidemia   4. Palpitations    PLAN:    In order of problems listed above:  Supraventricular tachycardia denies having any on appropriate medication which I will continue. Paroxysmal atrial fibrillation: No arrhythmia.  No indication for anticoagulation we will continue monitoring he does have device that allowed him to record his EKG if he requires any atrial fibrillation he will let me know Dyslipidemia he is taking Crestor I did review K PN which show me his LDL 71 HDL 64 we will continue present management. Palpitations: Denies having any   Medication Adjustments/Labs and Tests Ordered: Current medicines are reviewed at length with the patient today.  Concerns regarding medicines are outlined above.  Orders Placed This Encounter  Procedures   EKG 12-Lead   Medication changes: No orders of the defined types were placed in this encounter.   Signed, Georgeanna Lea, MD, [facility] 05/17/2022 4:46 PM    [facility]

## 2022-05-17 NOTE — Patient Instructions (Signed)

## 2022-07-12 ENCOUNTER — Other Ambulatory Visit: Payer: 59

## 2022-07-12 ENCOUNTER — Encounter: Payer: Self-pay | Admitting: Family Medicine

## 2022-07-12 ENCOUNTER — Encounter: Payer: 59 | Admitting: Family Medicine

## 2022-07-12 ENCOUNTER — Ambulatory Visit (INDEPENDENT_AMBULATORY_CARE_PROVIDER_SITE_OTHER): Payer: 59

## 2022-07-12 ENCOUNTER — Encounter: Payer: Self-pay | Admitting: Medical

## 2022-07-12 ENCOUNTER — Telehealth: Payer: Self-pay

## 2022-07-12 ENCOUNTER — Ambulatory Visit: Payer: 59 | Admitting: Medical

## 2022-07-12 ENCOUNTER — Ambulatory Visit (INDEPENDENT_AMBULATORY_CARE_PROVIDER_SITE_OTHER): Payer: 59 | Admitting: Family Medicine

## 2022-07-12 VITALS — BP 122/78 | HR 60 | Temp 97.8°F | Resp 16 | Ht 72.0 in | Wt 167.8 lb

## 2022-07-12 VITALS — BP 110/80 | Ht 72.0 in | Wt 167.0 lb

## 2022-07-12 DIAGNOSIS — M7989 Other specified soft tissue disorders: Secondary | ICD-10-CM

## 2022-07-12 DIAGNOSIS — M25571 Pain in right ankle and joints of right foot: Secondary | ICD-10-CM

## 2022-07-12 DIAGNOSIS — M79671 Pain in right foot: Secondary | ICD-10-CM

## 2022-07-12 DIAGNOSIS — S8261XA Displaced fracture of lateral malleolus of right fibula, initial encounter for closed fracture: Secondary | ICD-10-CM

## 2022-07-12 NOTE — Assessment & Plan Note (Signed)
Acutely occurring with initial injury on 1/25.  Having significant swelling and ecchymosis today.  He would elect to avoid surgery if at all possible. -Counseled on home exercise therapy and supportive care. -CAM Walker today. -Counseled on crutches. -Will reimage at follow-up

## 2022-07-12 NOTE — Progress Notes (Signed)
Subjective:    Patient ID: Darryl Curry, male    DOB: April 20, 1961, 62 y.o.   MRN: [id number]  HPI  Pt in for rt  ankle pain. Last week he tri[place] hose [place] airport. He describes inversion injury. Area swelled and limping. Moderate pain initially but could walk.   Using ibuprofen sparingly presently.     Review of Systems  Constitutional:  Negative for chills, fatigue and fever.  Respiratory:  Negative for cough, chest tightness and wheezing.   Cardiovascular:  Negative for chest pain and palpitations.  Gastrointestinal:  Negative for abdominal pain.  Musculoskeletal:  Negative for back pain.       Rt ankle pain and foot pain.  Neurological:  Negative for dizziness, tremors, seizures, syncope, numbness and headaches.  Hematological:  Negative for adenopathy. Does not bruise/bleed easily.  Psychiatric/Behavioral:  Negative for behavioral problems and confusion.     Past Medical History:  Diagnosis Date   Carpal tunnel syndrome of right wrist 01/18/2018   Dyslipidemia 04/03/2018   Dyspnea on exertion 04/03/2018   PAF (paroxysmal atrial fibrillation) ([place]) 07/11/2019   Palpitations 01/18/2018   Supraventricular tachycardia 05/28/2018   SVT (supraventricular tachycardia)      Social History   Socioeconomic History   Marital status: Married    Spouse name: Not on file   Number of children: Not on file   Years of education: Not on file   Highest education level: Not on file  Occupational History   Not on file  Tobacco Use   Smoking status: Former   Smokeless tobacco: Never  Vaping Use   Vaping Use: Never used  Substance and Sexual Activity   Alcohol use: Yes   Drug use: Never   Sexual activity: Not on file  Other Topics Concern   Not on file  Social History Narrative   Not on file   Social Determinants of Health   Financial Resource Strain: Not on file  Food Insecurity: Not on file  Transportation Needs: Not on file  Physical Activity: Not on file   Stress: Not on file  Social Connections: Not on file  Intimate Partner Violence: Not on file    Past Surgical History:  Procedure Laterality Date   [place]  2005   right 2005 and left 2008   VASECTOMY  2007    Family History  Problem Relation Age of Onset   Heart disease Mother    Heart attack Mother    Cancer Sister     No Known Allergies  Current Outpatient Medications on File Prior to Visit  Medication Sig Dispense Refill   cetirizine (ZYRTEC) 10 MG tablet Take 10 mg by mouth daily.     fluticasone (FLONASE) 50 MCG/ACT nasal spray Place 2 sprays into both nostrils daily. 16 g 2   Ibuprofen 200 MG CAPS Take 200 mg by mouth as needed (pain).     metoprolol succinate (TOPROL-XL) 50 MG 24 hr tablet TAKE ONE TABLET BY MOUTH DAILY. PATIENT MUST KEEP SCHEDULE APPT FOT CONTINUATION OF REFIL (Patient taking differently: Take 50 mg by mouth daily. TAKE ONE TABLET BY MOUTH DAILY. PATIENT MUST KEEP SCHEDULE APPT FOT CONTINUATION OF REFIL) 90 tablet 1   rosuvastatin (CRESTOR) 20 MG tablet Take 1 tablet (20 mg total) by mouth daily. 90 tablet 3   sildenafil (VIAGRA) 100 MG tablet Take 0.5-1 tablets (50-100 mg total) by mouth daily as needed for erectile dysfunction. 30 tablet 2  No current facility-administered medications on file prior to visit.    BP 122/78   Pulse 60   Temp 97.8 F (36.6 C) (Oral)   Resp 16   Ht 6' (1.829 m)   Wt 167 lb 12.8 oz (76.1 kg)   SpO2 97%   BMI 22.76 kg/m           Objective:   Physical Exam  General- No acute distress. Pleasant patient. Neck- Full range of motion, no jvd Lungs- Clear, even and unlabored. Heart- regular rate and rhythm. Neurologic- CNII- XII grossly intact.  Rt ankle- lateral ankle very swolen. Mild tender to palpation. Rt foot- mild tenderness to palpation distal metatarsal. Toes bruised.        Assessment & Plan:   Rt ankle and foot pain. Describes sprain injury. Significant swelling  5 days out. Will get xray foot and ankle. If fracture refer to sports med. If no fracture then advise rest, ice, compression and elevation.  Can use ibuprofen if needed.  Follow up date to be determined after xray review.  Mackie Pai, PA-C

## 2022-07-12 NOTE — Patient Instructions (Addendum)
Rt ankle and foot pain. Describes sprain injury. Significant swelling 5 days out. Will get xray foot and ankle. If fracture refer to sports med. If no fracture then advise rest, ice, compression and elevation.  Can use ibuprofen if needed.  Follow up date to be determined after xray review.

## 2022-07-12 NOTE — Patient Instructions (Signed)
Nice to meet you Please use ice as needed  Please use the boot and crutches  Please use tylenol as needed  Please send me a message in MyChart with any questions or updates.  Please see me back when you get back from your trip.   --Dr. Raeford Razor

## 2022-07-12 NOTE — Progress Notes (Signed)
  Darryl Curry - 62 y.o. male MRN [id number]  Date of birth: 11/24/1960  SUBJECTIVE:  Including CC & ROS.  No chief complaint on file.   Darryl Curry is a 62 y.o. male that is presenting with acute right ankle fracture.  He reports stepping over the gas pump while pulling up his car and tripped.  This occurred about 5 days ago.  Since that time he had significant swelling and ecchymosis..  Review of the office visit from earlier today shows he was ordered x-rays. Independent review of the right ankle x-ray from today shows a minimally displaced distal fibular fracture Independent review of the right foot x-ray from today shows an obliquely fracture distal fibular metaphysis diaphysis with a 4 mm step-off.  Review of Systems See HPI   HISTORY: Past Medical, Surgical, Social, and Family History Reviewed & Updated per EMR.   Pertinent Historical Findings include:  Past Medical History:  Diagnosis Date   Carpal tunnel syndrome of right wrist 01/18/2018   Dyslipidemia 04/03/2018   Dyspnea on exertion 04/03/2018   PAF (paroxysmal atrial fibrillation) ([place]) 07/11/2019   Palpitations 01/18/2018   Supraventricular tachycardia 05/28/2018   SVT (supraventricular tachycardia)     Past Surgical History:  Procedure Laterality Date   [place]  2005   right 2005 and left 2008   VASECTOMY  2007     PHYSICAL EXAM:  VS: BP 110/80 (BP Location: Left Arm, Patient Position: Sitting)   Ht 6' (1.829 m)   Wt 167 lb (75.8 kg)   BMI 22.65 kg/m  Physical Exam Gen: NAD, alert, cooperative with exam, well-appearing MSK:  Neurovascularly intact       ASSESSMENT & PLAN:   Displaced fracture of lateral malleolus of right fibula, initial encounter for closed fracture Acutely occurring with initial injury on 1/25.  Having significant swelling and ecchymosis today.  He would elect to avoid surgery if at all possible. -Counseled on home exercise therapy and supportive  care. -CAM Walker today. -Counseled on crutches. -Will reimage at follow-up

## 2022-07-12 NOTE — Addendum Note (Signed)
Addended by: Anabel Halon on: 07/12/2022 05:00 PM   Modules accepted: Orders

## 2022-07-12 NOTE — Progress Notes (Signed)
error 

## 2022-07-13 NOTE — Telephone Encounter (Signed)
done

## 2022-07-14 ENCOUNTER — Telehealth: Payer: Self-pay | Admitting: *Deleted

## 2022-07-14 DIAGNOSIS — S8261XA Displaced fracture of lateral malleolus of right fibula, initial encounter for closed fracture: Secondary | ICD-10-CM

## 2022-07-14 NOTE — Telephone Encounter (Signed)
I spoke to pt- he states he was able to get out of his work trip to [place]. He would like to proceed with surgeon referral. He is already scheduled 07/17/22 @ 10:00 am with Dr. Wilhelmina Mcardle. Referral sent to fax number below.

## 2022-07-14 NOTE — Telephone Encounter (Signed)
-----   Message from Rosemarie Ax, MD sent at 07/13/2022  1:52 PM EST ----- Regarding: FW: Patient cld wants to proceed w/ Darryl Curry Surgeon referral why ----- Message ----- From: Elberta Spaniel Sent: 07/13/2022   1:37 PM EST To: Rosemarie Ax, MD Subject: Patient cld wants to proceed w/ Darryl Curry Surgeon#  Pt cld says you mentioned poss  Ortho surgery if this is still an recommendation,per pt's Ins he has to go to an in Network :   [email or web address] Address: [address] [place], [place] [address] Phone: [phone] Fax [phone]  Please send referral to them.

## 2022-07-24 ENCOUNTER — Ambulatory Visit: Payer: 59 | Admitting: Family Medicine

## 2022-08-15 ENCOUNTER — Ambulatory Visit: Payer: 59 | Admitting: Family Medicine

## 2022-08-22 ENCOUNTER — Ambulatory Visit: Payer: 59 | Admitting: Family Medicine

## 2022-08-22 ENCOUNTER — Encounter: Payer: Self-pay | Admitting: Family Medicine

## 2022-08-22 VITALS — BP 128/84 | HR 61 | Temp 97.6°F | Ht 72.0 in | Wt 164.0 lb

## 2022-08-22 DIAGNOSIS — E785 Hyperlipidemia, unspecified: Secondary | ICD-10-CM | POA: Diagnosis not present

## 2022-08-22 DIAGNOSIS — R7303 Prediabetes: Secondary | ICD-10-CM | POA: Diagnosis not present

## 2022-08-22 LAB — COMPREHENSIVE METABOLIC PANEL
ALT: 36 U/L (ref 0–53)
AST: 33 U/L (ref 0–37)
Albumin: 4.3 g/dL (ref 3.5–5.2)
Alkaline Phosphatase: 89 U/L (ref 39–117)
BUN: 13 mg/dL (ref 6–23)
CO2: 26 mEq/L (ref 19–32)
Calcium: 9.8 mg/dL (ref 8.4–10.5)
Chloride: 99 mEq/L (ref 96–112)
Creatinine, Ser: 1.05 mg/dL (ref 0.40–1.50)
GFR: 75.53 mL/min (ref >60.00)
Glucose, Bld: 124 mg/dL — ABNORMAL HIGH (ref 70–99)
Potassium: 4.6 mEq/L (ref 3.5–5.1)
Sodium: 135 mEq/L (ref 135–145)
Total Bilirubin: 1.2 mg/dL (ref 0.2–1.2)
Total Protein: 7.1 g/dL (ref 6.0–8.3)

## 2022-08-22 LAB — LIPID PANEL
Cholesterol: 164 mg/dL (ref 0–200)
HDL: 71.4 mg/dL (ref >39.00)
LDL Cholesterol: 68 mg/dL (ref 0–99)
NonHDL: 92.43
Total CHOL/HDL Ratio: 2
Triglycerides: 122 mg/dL (ref 0.0–149.0)
VLDL: 24.4 mg/dL (ref 0.0–40.0)

## 2022-08-22 LAB — HEMOGLOBIN A1C: Hgb A1c MFr Bld: 5.7 % (ref 4.6–6.5)

## 2022-08-22 NOTE — Patient Instructions (Signed)
Give us 2-3 business days to get the results of your labs back.   Keep the diet clean and stay active.  Let us know if you need anything. 

## 2022-08-22 NOTE — Progress Notes (Signed)
Chief Complaint  Patient presents with   Follow-up    Subjective: Dyslipidemia Patient presents for Dyslipidemia follow up. Currently taking Crestor 20 mg/d and compliance with treatment thus far has been good. He denies myalgias. He is usually adhering to a healthy diet. Exercise: walking The patient is not known to have coexisting coronary artery disease. No CP or SOB.   Past Medical History:  Diagnosis Date   Carpal tunnel syndrome of right wrist 01/18/2018   Dyslipidemia 04/03/2018   Dyspnea on exertion 04/03/2018   PAF (paroxysmal atrial fibrillation) ([place]) 07/11/2019   Palpitations 01/18/2018   Supraventricular tachycardia 05/28/2018   SVT (supraventricular tachycardia)     Objective: BP 128/84 (BP Location: Left Arm, Cuff Size: Normal)   Pulse 61   Temp 97.6 F (36.4 C) (Oral)   Ht 6' (1.829 m)   Wt 164 lb (74.4 kg)   SpO2 99%   BMI 22.24 kg/m  General: Awake, appears stated age HEENT: MMM Heart: RRR, no LE edema, no bruits Lungs: CTAB, no rales, wheezes or rhonchi. No accessory muscle use Psych: Age appropriate judgment and insight, normal affect and mood  Assessment and Plan: Dyslipidemia - Plan: Comprehensive metabolic panel, Lipid panel  Prediabetes - Plan: Hemoglobin A1c  Chronic, stable.  Continue Crestor 20 mg daily.  Counseled on diet and exercise.  Will F/u in 6 mo or prn. The patient voiced understanding and agreement to the plan.  [place], DO 08/22/22  10:56 AM

## 2022-09-02 ENCOUNTER — Other Ambulatory Visit: Payer: Self-pay | Admitting: Family Medicine

## 2022-09-25 ENCOUNTER — Encounter: Payer: Self-pay | Admitting: *Deleted

## 2022-10-06 ENCOUNTER — Other Ambulatory Visit: Payer: Self-pay | Admitting: Cardiology

## 2022-10-21 ENCOUNTER — Ambulatory Visit (INDEPENDENT_AMBULATORY_CARE_PROVIDER_SITE_OTHER): Payer: 59

## 2022-10-21 ENCOUNTER — Ambulatory Visit
Admission: EM | Admit: 2022-10-21 | Discharge: 2022-10-21 | Disposition: A | Discharge status: Home / Self Care | Payer: 59 | Attending: Urgent Care | Admitting: Urgent Care

## 2022-10-21 DIAGNOSIS — M79644 Pain in right finger(s): Secondary | ICD-10-CM

## 2022-10-21 DIAGNOSIS — M79641 Pain in right hand: Secondary | ICD-10-CM

## 2022-10-21 MED ORDER — DICLOFENAC SODIUM 1 % EX GEL: 2.0000 g | Freq: Three times a day (TID) | CUTANEOUS | 0 refills | Status: DC

## 2022-10-21 MED ORDER — ACETAMINOPHEN 325 MG PO TABS: 650.0000 mg | ORAL_TABLET | Freq: Four times a day (QID) | ORAL | 0 refills | Status: DC | PRN

## 2022-10-21 NOTE — ED Provider Notes (Signed)
[facility]  Note:  This document was prepared using Conservation officer, historic buildings and may include unintentional dictation errors.  MRN: [id number] DOB: 10-27-1960  Subjective:   Darryl Curry is a 62 y.o. male presenting for right head injury while trying to get off the floor this morning.  Used his hand to push off of the floor by bawling up his fist.  Ever since then has had pain, swelling and feeling like he cannot extend his middle finger completely.  No current facility-administered medications for this encounter.  Current Outpatient Medications:    cetirizine (ZYRTEC) 10 MG tablet, Take 10 mg by mouth daily., Disp: , Rfl:    fluticasone (FLONASE) 50 MCG/ACT nasal spray, Place 2 sprays into both nostrils daily., Disp: 16 g, Rfl: 2   Ibuprofen 200 MG CAPS, Take 200 mg by mouth as needed (pain)., Disp: , Rfl:    metoprolol succinate (TOPROL-XL) 50 MG 24 hr tablet, Take 1 tablet (50 mg total) by mouth daily., Disp: 90 tablet, Rfl: 1   rosuvastatin (CRESTOR) 20 MG tablet, TAKE 1 TABLET BY MOUTH EVERY DAY, Disp: 90 tablet, Rfl: 3   sildenafil (VIAGRA) 100 MG tablet, Take 0.5-1 tablets (50-100 mg total) by mouth daily as needed for erectile dysfunction., Disp: 30 tablet, Rfl: 2   No Known Allergies  Past Medical History:  Diagnosis Date   Carpal tunnel syndrome of right wrist 01/18/2018   Dyslipidemia 04/03/2018   Dyspnea on exertion 04/03/2018   PAF (paroxysmal atrial fibrillation) (HCC) 07/11/2019   Palpitations 01/18/2018   Supraventricular tachycardia 05/28/2018   SVT (supraventricular tachycardia)      Past Surgical History:  Procedure Laterality Date   APPENDECTOMY  1993   HERNIA REPAIR  2005   right 2005 and left 2008   VASECTOMY  2007    Family History  Problem Relation Age of Onset   Heart disease Mother    Heart attack Mother    Cancer Sister     Social History   Tobacco Use   Smoking status: Former    Types: Cigarettes    Smokeless tobacco: Never  Vaping Use   Vaping Use: Never used  Substance Use Topics   Alcohol use: Yes    Comment: occ   Drug use: Never    ROS   Objective:   Vitals: BP (!) 154/92 (BP Location: Right Arm)   Temp 98 F (36.7 C) (Oral)   Resp 18   SpO2 94%   Physical Exam Constitutional:      General: He is not in acute distress.    Appearance: Normal appearance. He is well-developed and normal weight. He is not ill-appearing, toxic-appearing or diaphoretic.  HENT:     Head: Normocephalic and atraumatic.     Right Ear: External ear normal.     Left Ear: External ear normal.     Nose: Nose normal.     Mouth/Throat:     Pharynx: Oropharynx is clear.  Eyes:     General: No scleral icterus.       Right eye: No discharge.        Left eye: No discharge.     Extraocular Movements: Extraocular movements intact.  Cardiovascular:     Rate and Rhythm: Normal rate.  Pulmonary:     Effort: Pulmonary effort is normal.  Musculoskeletal:       Hands:     Cervical back: Normal range of motion.  Neurological:     Mental Status:  He is alert and oriented to person, place, and time.  Psychiatric:        Mood and Affect: Mood normal.        Behavior: Behavior normal.        Thought Content: Thought content normal.        Judgment: Judgment normal.    DG Hand Complete Right  Result Date: 10/21/2022 CLINICAL DATA:  Right hand pain.  Rule out dislocation. EXAM: RIGHT HAND - COMPLETE 3+ VIEW COMPARISON:  None FINDINGS: No signs of acute fracture or dislocation. Mild scattered degenerative changes noted including at the second and third MCP joints and PIP joints. There is a thin 2-3 mm radiodensity within the soft tissues adjacent to the tuft of the second distal phalanx. IMPRESSION: 1. No acute findings. 2. Mild degenerative joint disease. 3. Thin small radiodensity within the soft tissues adjacent to the tuft of the second distal phalanx. This is of uncertain clinical significance but  may reflect a small retained foreign body. Electronically Signed   By: Signa Kell M.D.   On: 10/21/2022 10:38     Assessment and Plan :   PDMP not reviewed this encounter.  1. Pain of right middle finger   2. Right hand pain    X-ray negative for dislocation which is the primary concern for patient.  Suspect an extensor ligament injury.  Recommended conservative management with Tylenol, resting the hand, diclofenac gel and very close follow-up with [facility].  Counseled patient on potential for adverse effects with medications prescribed/recommended today, ER and return-to-clinic precautions discussed, patient verbalized understanding.    Wallis Bamberg, PA-C 10/21/22 1203

## 2022-10-21 NOTE — ED Triage Notes (Signed)
Pt c/o injury to right hand when he was bearing weight to push self up ~1-2 hours PTA-pain to middle finger-NAD-steady gait

## 2023-01-25 ENCOUNTER — Encounter (INDEPENDENT_AMBULATORY_CARE_PROVIDER_SITE_OTHER): Payer: Self-pay

## 2023-02-21 ENCOUNTER — Encounter: Payer: Self-pay | Admitting: Family Medicine

## 2023-02-21 ENCOUNTER — Ambulatory Visit (INDEPENDENT_AMBULATORY_CARE_PROVIDER_SITE_OTHER): Payer: 59 | Admitting: Family Medicine

## 2023-02-21 VITALS — BP 120/70 | HR 58 | Temp 97.7°F | Ht 72.0 in | Wt 164.4 lb

## 2023-02-21 DIAGNOSIS — Z1211 Encounter for screening for malignant neoplasm of colon: Secondary | ICD-10-CM

## 2023-02-21 DIAGNOSIS — Z0000 Encounter for general adult medical examination without abnormal findings: Secondary | ICD-10-CM | POA: Diagnosis not present

## 2023-02-21 DIAGNOSIS — R7303 Prediabetes: Secondary | ICD-10-CM | POA: Insufficient documentation

## 2023-02-21 LAB — COMPREHENSIVE METABOLIC PANEL
ALT: 26 U/L (ref 0–53)
AST: 22 U/L (ref 0–37)
Albumin: 4.1 g/dL (ref 3.5–5.2)
Alkaline Phosphatase: 78 U/L (ref 39–117)
BUN: 10 mg/dL (ref 6–23)
CO2: 28 meq/L (ref 19–32)
Calcium: 9.2 mg/dL (ref 8.4–10.5)
Chloride: 105 meq/L (ref 96–112)
Creatinine, Ser: 1.19 mg/dL (ref 0.40–1.50)
GFR: 64.77 mL/min (ref >60.00)
Glucose, Bld: 98 mg/dL (ref 70–99)
Potassium: 4.6 meq/L (ref 3.5–5.1)
Sodium: 139 meq/L (ref 135–145)
Total Bilirubin: 0.4 mg/dL (ref 0.2–1.2)
Total Protein: 6.6 g/dL (ref 6.0–8.3)

## 2023-02-21 LAB — CBC
HCT: 42.1 % (ref 39.0–52.0)
Hemoglobin: 13.7 g/dL (ref 13.0–17.0)
MCHC: 32.6 g/dL (ref 30.0–36.0)
MCV: 96.9 fl (ref 78.0–100.0)
Platelets: 273 10*3/uL (ref 150.0–400.0)
RBC: 4.34 Mil/uL (ref 4.22–5.81)
RDW: 13 % (ref 11.5–15.5)
WBC: 5.2 10*3/uL (ref 4.0–10.5)

## 2023-02-21 LAB — LIPID PANEL
Cholesterol: 152 mg/dL (ref 0–200)
HDL: 63.4 mg/dL (ref >39.00)
LDL Cholesterol: 75 mg/dL (ref 0–99)
NonHDL: 88.94
Total CHOL/HDL Ratio: 2
Triglycerides: 71 mg/dL (ref 0.0–149.0)
VLDL: 14.2 mg/dL (ref 0.0–40.0)

## 2023-02-21 LAB — HEMOGLOBIN A1C: Hgb A1c MFr Bld: 5.8 % (ref 4.6–6.5)

## 2023-02-21 MED ORDER — SILDENAFIL CITRATE 100 MG PO TABS: 50.0000 mg | ORAL_TABLET | Freq: Every day | ORAL | Status: DC | PRN

## 2023-02-21 MED ORDER — SILDENAFIL CITRATE 100 MG PO TABS: 50.0000 mg | ORAL_TABLET | Freq: Every day | ORAL | 1 refills | Status: DC | PRN

## 2023-02-21 MED ORDER — MONTELUKAST SODIUM 10 MG PO TABS: 10.0000 mg | ORAL_TABLET | Freq: Every day | ORAL | 3 refills | Status: DC

## 2023-02-21 NOTE — Patient Instructions (Addendum)
Give [place] 2-3 business days to get the results of your labs back.   Keep the diet clean and stay active.  I recommend getting the flu shot in mid October. This suggestion would change if the CDC comes out with a different recommendation.   Please get me a copy of your advanced directive form at your convenience.   Send me a message in 1 month if no better.   Let us know if you need anything.

## 2023-02-21 NOTE — Progress Notes (Signed)
Chief Complaint  Patient presents with   Annual Exam    Well Male Darryl Curry is here for a complete physical.   His last physical was >1 year ago.  Current diet: in general, a "healthy" diet.  Current exercise: none Weight trend: stable Fatigue out of ordinary? No. Seat belt? Yes.   Advanced directive? Yes  Health maintenance Shingrix- No- never  Colonoscopy- Yes Tetanus- Yes HIV- Yes Hep C- Yes   Past Medical History:  Diagnosis Date   Carpal tunnel syndrome of right wrist 01/18/2018   Dyslipidemia 04/03/2018   Dyspnea on exertion 04/03/2018   PAF (paroxysmal atrial fibrillation) (HCC) 07/11/2019   Palpitations 01/18/2018   Supraventricular tachycardia 05/28/2018   SVT (supraventricular tachycardia)       Past Surgical History:  Procedure Laterality Date   APPENDECTOMY  1993   HERNIA REPAIR  2005   right 2005 and left 2008   VASECTOMY  2007    Medications  Current Outpatient Medications on File Prior to Visit  Medication Sig Dispense Refill   cetirizine (ZYRTEC) 10 MG tablet Take 10 mg by mouth daily.     metoprolol succinate (TOPROL-XL) 50 MG 24 hr tablet Take 1 tablet (50 mg total) by mouth daily. 90 tablet 1   rosuvastatin (CRESTOR) 20 MG tablet TAKE 1 TABLET BY MOUTH EVERY DAY 90 tablet 3   No current facility-administered medications on file prior to visit.     Allergies No Known Allergies  Family History Family History  Problem Relation Age of Onset   Heart disease Mother    Heart attack Mother    Cancer Sister     Review of Systems: Constitutional:  no fevers Eye:  no recent significant change in vision Ear/Nose/Mouth/Throat:  Ears:  no hearing loss Nose/Mouth/Throat:  no complaints of nasal congestion, no sore throat Cardiovascular:  no chest pain Respiratory:  no shortness of breath Gastrointestinal:  no change in bowel habits GU:  Male: negative for dysuria, frequency Musculoskeletal/Extremities:  no joint pain Integumentary  (Skin/Breast):  no abnormal skin lesions reported Neurologic:  no headaches Endocrine: No unexpected weight changes Hematologic/Lymphatic:  no abnormal bleeding  Exam BP 120/70 (BP Location: Right Arm, Patient Position: Sitting, Cuff Size: Normal)   Pulse (!) 58   Temp 97.7 F (36.5 C) (Oral)   Ht 6' (1.829 m)   Wt 164 lb 6 oz (74.6 kg)   SpO2 96%   BMI 22.29 kg/m  General:  well developed, well nourished, in no apparent distress Skin:  no significant moles, warts, or growths Head:  no masses, lesions, or tenderness Eyes:  pupils equal and round, sclera anicteric without injection Ears:  canals without lesions, TMs shiny without retraction, no obvious effusion, no erythema Nose:  nares patent, mucosa normal Throat/Pharynx:  lips and gingiva without lesion; tongue and uvula midline; non-inflamed pharynx; no exudates or postnasal drainage Neck: neck supple without adenopathy, thyromegaly, or masses Cardiac: RRR, no bruits, no LE edema Lungs:  clear to auscultation, breath sounds equal bilaterally, no respiratory distress Abdomen: BS+, soft, non-tender, non-distended, no masses or organomegaly noted Rectal: Deferred Musculoskeletal:  symmetrical muscle groups noted without atrophy or deformity Neuro:  gait normal; deep tendon reflexes normal and symmetric Psych: well oriented with normal range of affect and appropriate judgment/insight  Assessment and Plan  Well adult exam - Plan: CBC, Comprehensive metabolic panel, Lipid panel  Prediabetes - Plan: Hemoglobin A1c  Screen for colon cancer - Plan: Ambulatory referral to Gastroenterology   Well  62 y.o. male. Counseled on diet and exercise. Counseled on risks and benefits of prostate cancer screening with PSA. The patient agrees to forego testing. CCS: Refer to gastroenterology. Advanced directive form requested today.  Immunizations, labs, and further orders as above. Follow up in 6 mo. The patient voiced understanding and  agreement to the plan.  Jilda Roche [place], DO 02/21/23 12:10 PM

## 2023-04-12 ENCOUNTER — Other Ambulatory Visit: Payer: Self-pay | Admitting: Cardiology

## 2023-05-19 ENCOUNTER — Other Ambulatory Visit: Payer: Self-pay | Admitting: Family Medicine

## 2023-07-11 ENCOUNTER — Other Ambulatory Visit: Payer: Self-pay | Admitting: Cardiology

## 2023-08-14 ENCOUNTER — Other Ambulatory Visit: Payer: Self-pay | Admitting: Cardiology

## 2023-08-21 ENCOUNTER — Ambulatory Visit: Payer: 59 | Admitting: Family Medicine

## 2023-08-21 ENCOUNTER — Encounter: Payer: Self-pay | Admitting: Family Medicine

## 2023-08-21 ENCOUNTER — Other Ambulatory Visit: Payer: Self-pay | Admitting: Family Medicine

## 2023-08-21 VITALS — BP 130/78 | HR 60 | Resp 20 | Ht 72.0 in | Wt 163.6 lb

## 2023-08-21 DIAGNOSIS — Z1211 Encounter for screening for malignant neoplasm of colon: Secondary | ICD-10-CM | POA: Diagnosis not present

## 2023-08-21 DIAGNOSIS — J302 Other seasonal allergic rhinitis: Secondary | ICD-10-CM | POA: Diagnosis not present

## 2023-08-21 DIAGNOSIS — E785 Hyperlipidemia, unspecified: Secondary | ICD-10-CM | POA: Diagnosis not present

## 2023-08-21 LAB — COMPREHENSIVE METABOLIC PANEL
ALT: 32 U/L (ref 0–53)
AST: 25 U/L (ref 0–37)
Albumin: 4.6 g/dL (ref 3.5–5.2)
Alkaline Phosphatase: 79 U/L (ref 39–117)
BUN: 18 mg/dL (ref 6–23)
CO2: 29 meq/L (ref 19–32)
Calcium: 9.6 mg/dL (ref 8.4–10.5)
Chloride: 101 meq/L (ref 96–112)
Creatinine, Ser: 1.12 mg/dL (ref 0.40–1.50)
GFR: 69.42 mL/min (ref >60.00)
Glucose, Bld: 124 mg/dL — ABNORMAL HIGH (ref 70–99)
Potassium: 4.6 meq/L (ref 3.5–5.1)
Sodium: 137 meq/L (ref 135–145)
Total Bilirubin: 0.9 mg/dL (ref 0.2–1.2)
Total Protein: 7.2 g/dL (ref 6.0–8.3)

## 2023-08-21 LAB — LIPID PANEL
Cholesterol: 175 mg/dL (ref 0–200)
HDL: 66.8 mg/dL (ref >39.00)
LDL Cholesterol: 88 mg/dL (ref 0–99)
NonHDL: 108.58
Total CHOL/HDL Ratio: 3
Triglycerides: 105 mg/dL (ref 0.0–149.0)
VLDL: 21 mg/dL (ref 0.0–40.0)

## 2023-08-21 MED ORDER — METOPROLOL SUCCINATE ER 50 MG PO TB24: 50.0000 mg | ORAL_TABLET | Freq: Every day | ORAL | 0 refills | Status: DC

## 2023-08-21 MED ORDER — ROSUVASTATIN CALCIUM 20 MG PO TABS: 20.0000 mg | ORAL_TABLET | Freq: Every day | ORAL | 3 refills | Status: AC

## 2023-08-21 NOTE — Patient Instructions (Addendum)
 Give [place] 2-3 business days to get the results of your labs back.   Keep the diet clean and stay active.  Please consider adding some weight resistance exercise to your routine. Consider yoga as well.   Please sched a visit with Dr. Vanetta Shawl office.   If you do not hear anything about your referral in the next 1-2 weeks, call our office and ask for an update.  Let us know if you need anything.

## 2023-08-21 NOTE — Progress Notes (Signed)
 Chief Complaint  Patient presents with   Medical Management of Chronic Issues    Patient presents today for a 6 month follow-up    Subjective: Dyslipidemia Patient presents for dyslipidemia follow up. Currently taking Crestor 20 mg/d and compliance with treatment thus far has been good. He denies myalgias. He is adhering to a healthy diet. Exercise: walking No CP or SOB.  The patient is not known to have coexisting coronary artery disease.  Other than patient has a history of seasonal allergies.  He usually takes Zyrtec 10 mg daily.  No issues with this.  He will also take montelukast 10 mg nightly as needed.  No adverse effects.  He takes it as needed.  Symptoms are currently well-controlled.  Past Medical History:  Diagnosis Date   Carpal tunnel syndrome of right wrist 01/18/2018   Dyslipidemia 04/03/2018   Dyspnea on exertion 04/03/2018   PAF (paroxysmal atrial fibrillation) (HCC) 07/11/2019   Palpitations 01/18/2018   Supraventricular tachycardia (HCC) 05/28/2018   SVT (supraventricular tachycardia) (HCC)     Objective: BP 130/78   Pulse 60   Resp 20   Ht 6' (1.829 m)   Wt 163 lb 9.6 oz (74.2 kg)   SpO2 98%   BMI 22.19 kg/m  General: Awake, appears stated age Heart: RRR, no LE edema, no bruits Lungs: CTAB, no rales, wheezes or rhonchi. No accessory muscle use Psych: Age appropriate judgment and insight, normal affect and mood  Assessment and Plan: Dyslipidemia - Plan: Comprehensive metabolic panel, Lipid panel  Seasonal allergies  Screen for colon cancer - Plan: Ambulatory referral to Gastroenterology  Chronic, stable.  Continue Crestor 20 mg daily.  Counseled on diet and exercise.  Recommended more weight training. Chronic, stable.  Continue Zyrtec 10 mg daily, montelukast 10 mg daily as needed. Refer to gastroenterology again.  He will reach out if he does not hear anything. F/u in 6 mo. The patient voiced understanding and agreement to the plan.  Jilda Roche [place], DO 08/21/23  10:24 AM

## 2023-11-17 ENCOUNTER — Other Ambulatory Visit: Payer: Self-pay | Admitting: Family Medicine

## 2024-02-22 ENCOUNTER — Encounter: Payer: 59 | Admitting: Family Medicine

## 2024-02-26 ENCOUNTER — Encounter: Payer: Self-pay | Admitting: Internal Medicine

## 2024-02-27 ENCOUNTER — Ambulatory Visit (INDEPENDENT_AMBULATORY_CARE_PROVIDER_SITE_OTHER): Admitting: Family Medicine

## 2024-02-27 ENCOUNTER — Encounter: Payer: Self-pay | Admitting: Family Medicine

## 2024-02-27 VITALS — BP 124/76 | HR 66 | Temp 98.0°F | Resp 16 | Ht 73.0 in | Wt 161.4 lb

## 2024-02-27 DIAGNOSIS — Z0000 Encounter for general adult medical examination without abnormal findings: Secondary | ICD-10-CM

## 2024-02-27 DIAGNOSIS — R7303 Prediabetes: Secondary | ICD-10-CM | POA: Diagnosis not present

## 2024-02-27 MED ORDER — METOPROLOL SUCCINATE ER 50 MG PO TB24: 50.0000 mg | ORAL_TABLET | Freq: Every day | ORAL | 3 refills | Status: AC

## 2024-02-27 NOTE — Patient Instructions (Addendum)
 Give us  2-3 business days to get the results of your labs back.   Keep the diet clean and stay active.  Please get me a copy of your advanced directive form at your convenience.   Please consider getting the pneumonia vaccine (PCV20).   I recommend getting the flu shot in mid October. This suggestion would change if the CDC comes out with a different recommendation.   Let us  know if you need anything.

## 2024-02-27 NOTE — Progress Notes (Signed)
 Chief Complaint  Patient presents with   Annual Exam    CPE    Well Male Darryl Curry is here for a complete physical.   His last physical was >1 year ago.  Current diet: in general, a healthy diet.   Current exercise: walking, lifting wts Weight trend: stable Fatigue out of ordinary? No. Seat belt? Yes.   Advanced directive? No  Health maintenance Shingrix- No Colonoscopy- Due Tetanus- Yes Hep C- Yes Pneumonia vaccine- No  Past Medical History:  Diagnosis Date   Carpal tunnel syndrome of right wrist 01/18/2018   Dyslipidemia 04/03/2018   Dyspnea on exertion 04/03/2018   PAF (paroxysmal atrial fibrillation) (HCC) 07/11/2019   Palpitations 01/18/2018   Supraventricular tachycardia (HCC) 05/28/2018   SVT (supraventricular tachycardia) [facility])      Past Surgical History:  Procedure Laterality Date   APPENDECTOMY  1993   HERNIA REPAIR  2005   right 2005 and left 2008   VASECTOMY  2007    Medications  Current Outpatient Medications on File Prior to Visit  Medication Sig Dispense Refill   cetirizine (ZYRTEC) 10 MG tablet Take 10 mg by mouth daily.     metoprolol  succinate (TOPROL -XL) 50 MG 24 hr tablet TAKE 1 TABLET BY MOUTH EVERY DAY 90 tablet 1   montelukast  (SINGULAIR ) 10 MG tablet TAKE 1 TABLET BY MOUTH EVERYDAY AT BEDTIME 90 tablet 1   rosuvastatin  (CRESTOR ) 20 MG tablet Take 1 tablet (20 mg total) by mouth daily. 90 tablet 3   sildenafil  (VIAGRA ) 100 MG tablet Take 0.5-1 tablets (50-100 mg total) by mouth daily as needed for erectile dysfunction. 30 tablet 1    Allergies No Known Allergies  Family History Family History  Problem Relation Age of Onset   Heart disease Mother    Heart attack Mother    Cancer Sister     Review of Systems: Constitutional:  no fevers Eye:  no recent significant change in vision Ears:  No changes in hearing Nose/Mouth/Throat:  no complaints of nasal congestion, no sore throat Cardiovascular: no chest pain Respiratory:  No  shortness of breath Gastrointestinal:  No change in bowel habits GU:  No frequency Integumentary:  no abnormal skin lesions reported Neurologic:  no headaches Endocrine:  denies unexplained weight changes  Exam BP 124/76 (BP Location: Left Arm, Patient Position: Sitting)   Pulse 66   Temp 98 F (36.7 C) (Oral)   Resp 16   Ht 6' 1 (1.854 m)   Wt 161 lb 6.4 oz (73.2 kg)   SpO2 98%   BMI 21.29 kg/m  General:  well developed, well nourished, in no apparent distress Skin:  no significant moles, warts, or growths Head:  no masses, lesions, or tenderness Eyes:  pupils equal and round, sclera anicteric without injection Ears:  canals without lesions, TMs shiny without retraction, no obvious effusion, no erythema Nose:  nares patent, mucosa normal Throat/Pharynx:  lips and gingiva without lesion; tongue and uvula midline; non-inflamed pharynx; no exudates or postnasal drainage Lungs:  clear to auscultation, breath sounds equal bilaterally, no respiratory distress Cardio:  regular rate and rhythm, no LE edema or bruits Rectal: Deferred GI: BS+, S, NT, ND, no masses or organomegaly Musculoskeletal:  symmetrical muscle groups noted without atrophy or deformity Neuro:  gait normal; deep tendon reflexes normal and symmetric Psych: well oriented with normal range of affect and appropriate judgment/insight  Assessment and Plan  Well adult exam - Plan: CBC, Comprehensive metabolic panel with GFR, Lipid panel  Prediabetes - Plan: Hemoglobin A1c   Well 63 y.o. male. Counseled on diet and exercise. Advanced directive form requested today.  Flu shot and Shingrix politely declined.  PCV20 rec'd, he will look into this.  CCS scheduled for 10/2.  Other orders as above. Follow up in 6 mo.  The patient voiced understanding and agreement to the plan.  Mabel [place], DO 02/27/24 3:14 PM

## 2024-02-28 ENCOUNTER — Ambulatory Visit: Payer: Self-pay | Admitting: Family Medicine

## 2024-02-28 LAB — CBC
HCT: 42.5 % (ref 39.0–52.0)
Hemoglobin: 14.3 g/dL (ref 13.0–17.0)
MCHC: 33.7 g/dL (ref 30.0–36.0)
MCV: 94.5 fl (ref 78.0–100.0)
Platelets: 244 K/uL (ref 150.0–400.0)
RBC: 4.5 Mil/uL (ref 4.22–5.81)
RDW: 12.9 % (ref 11.5–15.5)
WBC: 6.1 K/uL (ref 4.0–10.5)

## 2024-02-28 LAB — COMPREHENSIVE METABOLIC PANEL WITH GFR
ALT: 27 U/L (ref 0–53)
AST: 19 U/L (ref 0–37)
Albumin: 4.6 g/dL (ref 3.5–5.2)
Alkaline Phosphatase: 72 U/L (ref 39–117)
BUN: 14 mg/dL (ref 6–23)
CO2: 28 meq/L (ref 19–32)
Calcium: 9.7 mg/dL (ref 8.4–10.5)
Chloride: 99 meq/L (ref 96–112)
Creatinine, Ser: 1.1 mg/dL (ref 0.40–1.50)
GFR: 70.67 mL/min (ref >60.00)
Glucose, Bld: 106 mg/dL — ABNORMAL HIGH (ref 70–99)
Potassium: 4.8 meq/L (ref 3.5–5.1)
Sodium: 135 meq/L (ref 135–145)
Total Bilirubin: 1 mg/dL (ref 0.2–1.2)
Total Protein: 7 g/dL (ref 6.0–8.3)

## 2024-02-28 LAB — LIPID PANEL
Cholesterol: 185 mg/dL (ref 0–200)
HDL: 59 mg/dL (ref >39.00)
LDL Cholesterol: 104 mg/dL — ABNORMAL HIGH (ref 0–99)
NonHDL: 125.73
Total CHOL/HDL Ratio: 3
Triglycerides: 110 mg/dL (ref 0.0–149.0)
VLDL: 22 mg/dL (ref 0.0–40.0)

## 2024-02-28 LAB — HEMOGLOBIN A1C: Hgb A1c MFr Bld: 6.1 % (ref 4.6–6.5)

## 2024-03-04 ENCOUNTER — Ambulatory Visit (AMBULATORY_SURGERY_CENTER)

## 2024-03-04 ENCOUNTER — Encounter: Payer: Self-pay | Admitting: Internal Medicine

## 2024-03-04 VITALS — Ht 73.0 in | Wt 161.4 lb

## 2024-03-04 DIAGNOSIS — Z1211 Encounter for screening for malignant neoplasm of colon: Secondary | ICD-10-CM

## 2024-03-04 MED ORDER — NA SULFATE-K SULFATE-MG SULF 17.5-3.13-1.6 GM/177ML PO SOLN: 1.0000 | Freq: Once | ORAL | 0 refills | Status: AC

## 2024-03-04 NOTE — Progress Notes (Signed)
 No egg or soy allergy known to patient  No issues known to pt with past sedation with any surgeries or procedures Patient denies ever being told they had issues or difficulty with intubation  No FH of Malignant Hyperthermia Pt is not on diet pills Pt is not on  home 02  Pt is not on blood thinners  Pt denies issues with constipation  No A fib or A flutter Have any cardiac testing pending-- no  LOA: independent  Prep: suprep   Patient's chart reviewed by Norleen Schillings CNRA prior to previsit and patient appropriate for the [facility].  Previsit completed and red dot placed by patient's name on their procedure day (on provider's schedule).     PV completed with patient. Prep instructions sent via mychart and hard copy given at Pv apt.

## 2024-03-13 ENCOUNTER — Encounter: Payer: Self-pay | Admitting: Internal Medicine

## 2024-03-13 ENCOUNTER — Ambulatory Visit: Admitting: Internal Medicine

## 2024-03-13 VITALS — BP 132/83 | HR 53 | Temp 98.0°F | Resp 11 | Ht 73.0 in | Wt 161.0 lb

## 2024-03-13 DIAGNOSIS — K5730 Diverticulosis of large intestine without perforation or abscess without bleeding: Secondary | ICD-10-CM | POA: Diagnosis not present

## 2024-03-13 DIAGNOSIS — D123 Benign neoplasm of transverse colon: Secondary | ICD-10-CM

## 2024-03-13 DIAGNOSIS — Z1211 Encounter for screening for malignant neoplasm of colon: Secondary | ICD-10-CM | POA: Diagnosis present

## 2024-03-13 MED ORDER — SODIUM CHLORIDE 0.9 % IV SOLN: 500.0000 mL | Freq: Once | INTRAVENOUS | Status: AC

## 2024-03-13 NOTE — Progress Notes (Signed)
 Called to room to assist during endoscopic procedure.  Patient ID and intended procedure confirmed with present staff. Received instructions for my participation in the procedure from the performing physician.

## 2024-03-13 NOTE — Op Note (Signed)
 [facility] Patient Name: Darryl Curry Procedure Date: 03/13/2024 10:35 AM MRN: [id number] Endoscopist: Norleen SAILOR. Abran , MD, [id number] Age: 63 Referring MD:  Date of Birth: 26-Nov-1960 Gender: Male Account #: [id number] Procedure:                Colonoscopy with cold snare polypectomy x 1 Indications:              Screening for colorectal malignant neoplasm Medicines:                Monitored Anesthesia Care Procedure:                Pre-Anesthesia Assessment:                           - Prior to the procedure, a History and Physical                            was performed, and patient medications and                            allergies were reviewed. The patient's tolerance of                            previous anesthesia was also reviewed. The risks                            and benefits of the procedure and the sedation                            options and risks were discussed with the patient.                            All questions were answered, and informed consent                            was obtained. Prior Anticoagulants: The patient has                            taken no anticoagulant or antiplatelet agents. ASA                            Grade Assessment: II - A patient with mild systemic                            disease. After reviewing the risks and benefits,                            the patient was deemed in satisfactory condition to                            undergo the procedure.                           After obtaining informed consent, the colonoscope  was passed under direct vision. Throughout the                            procedure, the patient's blood pressure, pulse, and                            oxygen saturations were monitored continuously. The                            Olympus Scope [id number] was introduced through the                            anus and advanced to the the cecum, identified by                             appendiceal orifice and ileocecal valve. The                            ileocecal valve, appendiceal orifice, and rectum                            were photographed. The quality of the bowel                            preparation was excellent. The colonoscopy was                            performed without difficulty. The patient tolerated                            the procedure well. The bowel preparation used was                            SUPREP via split dose instruction. Scope In: 10:50:29 AM Scope Out: 11:04:00 AM Scope Withdrawal Time: 0 hours 8 minutes 28 seconds  Total Procedure Duration: 0 hours 13 minutes 31 seconds  Findings:                 A 5 mm polyp was found in the transverse colon. The                            polyp was removed with a cold snare. Resection and                            retrieval were complete.                           Many diverticula were found in the left colon.                           The exam was otherwise without abnormality on                            direct and retroflexion views. Complications:  No immediate complications. Estimated blood loss:                            None. Estimated Blood Loss:     Estimated blood loss: none. Impression:               - One 5 mm polyp in the transverse colon, removed                            with a cold snare. Resected and retrieved.                           - Diverticulosis in the left colon.                           - The examination was otherwise normal on direct                            and retroflexion views. Recommendation:           - Repeat colonoscopy in 7-10 years for surveillance.                           - Patient has a contact number available for                            emergencies. The signs and symptoms of potential                            delayed complications were discussed with the                            patient. Return to normal  activities tomorrow.                            Written discharge instructions were provided to the                            patient.                           - Resume previous diet.                           - Continue present medications.                           - Await pathology results. Norleen SAILOR. Abran, MD 03/13/2024 11:08:27 AM This report has been signed electronically.

## 2024-03-13 NOTE — Progress Notes (Signed)
 Vss nad trans to pacu

## 2024-03-13 NOTE — Patient Instructions (Signed)
-  Handout on polyps provided -Await on pathology results  YOU HAD AN ENDOSCOPIC PROCEDURE TODAY AT THE [facility]:   Refer to the procedure report that was given to you for any specific questions about what was found during the examination.  If the procedure report does not answer your questions, please call your gastroenterologist to clarify.  If you requested that your care partner not be given the details of your procedure findings, then the procedure report has been included in a sealed envelope for you to review at your convenience later.  YOU SHOULD EXPECT: Some feelings of bloating in the abdomen. Passage of more gas than usual.  Walking can help get rid of the air that was put into your GI tract during the procedure and reduce the bloating. If you had a lower endoscopy (such as a colonoscopy or flexible sigmoidoscopy) you may notice spotting of blood in your stool or on the toilet paper. If you underwent a bowel prep for your procedure, you may not have a normal bowel movement for a few days.  Please Note:  You might notice some irritation and congestion in your nose or some drainage.  This is from the oxygen used during your procedure.  There is no need for concern and it should clear up in a day or so.  SYMPTOMS TO REPORT IMMEDIATELY:  Following lower endoscopy (colonoscopy or flexible sigmoidoscopy):  Excessive amounts of blood in the stool  Significant tenderness or worsening of abdominal pains  Swelling of the abdomen that is new, acute  Fever of 100F or higher  For urgent or emergent issues, a gastroenterologist can be reached at any hour by calling [phone]. Do not use MyChart messaging for urgent concerns.    DIET:  We do recommend a small meal at first, but then you may proceed to your regular diet.  Drink plenty of fluids but you should avoid alcoholic beverages for 24 hours.  ACTIVITY:  You should plan to take it easy for the rest of today and you should  NOT DRIVE or use heavy machinery until tomorrow (because of the sedation medicines used during the test).    FOLLOW UP: Our staff will call the number listed on your records the next business day following your procedure.  We will call around 7:15- 8:00 am to check on you and address any questions or concerns that you may have regarding the information given to you following your procedure. If we do not reach you, we will leave a message.     If any biopsies were taken you will be contacted by phone or by letter within the next 1-3 weeks.  Please call us  at [phone] if you have not heard about the biopsies in 3 weeks.    SIGNATURES/CONFIDENTIALITY: You and/or your care partner have signed paperwork which will be entered into your electronic medical record.  These signatures attest to the fact that that the information above on your After Visit Summary has been reviewed and is understood.  Full responsibility of the confidentiality of this discharge information lies with you and/or your care-partner.

## 2024-03-13 NOTE — Progress Notes (Signed)
 HISTORY OF PRESENT ILLNESS:  Darryl Curry is a 63 y.o. male sent directly for screening colonoscopy.  No complaints  REVIEW OF SYSTEMS:  All non-GI ROS negative except for  Past Medical History:  Diagnosis Date   Carpal tunnel syndrome of right wrist 01/18/2018   Dyslipidemia 04/03/2018   Dyspnea on exertion 04/03/2018   PAF (paroxysmal atrial fibrillation) (HCC) 07/11/2019   Palpitations 01/18/2018   Supraventricular tachycardia 05/28/2018   SVT (supraventricular tachycardia)     Past Surgical History:  Procedure Laterality Date   APPENDECTOMY  1993   HERNIA REPAIR  2005   right 2005 and left 2008   VASECTOMY  2007    Social History Darryl Curry  reports that he has quit smoking. His smoking use included cigarettes. He has never used smokeless tobacco. He reports current alcohol use. He reports that he does not use drugs.  family history includes Cancer in his sister; Heart attack in his mother; Heart disease in his mother.  No Known Allergies     PHYSICAL EXAMINATION: Vital signs: BP (!) 138/91   Pulse 60   Temp 98 F (36.7 C)   Ht 6' 1 (1.854 m)   Wt 161 lb (73 kg)   SpO2 97%   BMI 21.24 kg/m  General: Well-developed, well-nourished, no acute distress HEENT: Sclerae are anicteric, conjunctiva pink. Oral mucosa intact Lungs: Clear Heart: Regular Abdomen: soft, nontender, nondistended, no obvious ascites, no peritoneal signs, normal bowel sounds. No organomegaly. Extremities: No edema Psychiatric: alert and oriented x3. Cooperative     ASSESSMENT:  Colon cancer screening   PLAN:  Screening colonoscopy

## 2024-03-14 ENCOUNTER — Telehealth: Payer: Self-pay | Admitting: *Deleted

## 2024-03-14 NOTE — Telephone Encounter (Signed)
  Follow up Call-     03/13/2024   10:03 AM  Call back number  Post procedure Call Back phone  # [phone]  Permission to leave phone message Yes     Patient questions:  Do you have a fever, pain , or abdominal swelling? No. Pain Score  0 *  Have you tolerated food without any problems? Yes.    Have you been able to return to your normal activities? Yes.    Do you have any questions about your discharge instructions: Diet   No. Medications  No. Follow up visit  No.  Do you have questions or concerns about your Care? No.  Actions: * If pain score is 4 or above: No action needed, pain <4.

## 2024-03-17 LAB — SURGICAL PATHOLOGY

## 2024-03-18 ENCOUNTER — Ambulatory Visit: Payer: Self-pay | Admitting: Internal Medicine

## 2024-06-13 ENCOUNTER — Other Ambulatory Visit: Payer: Self-pay | Admitting: Family Medicine

## 2024-07-11 ENCOUNTER — Other Ambulatory Visit: Payer: Self-pay | Admitting: Internal Medicine

## 2024-07-11 DIAGNOSIS — Z1211 Encounter for screening for malignant neoplasm of colon: Secondary | ICD-10-CM

## 2024-08-26 ENCOUNTER — Ambulatory Visit: Admitting: Family Medicine

## 2025-02-27 ENCOUNTER — Encounter: Admitting: Family Medicine
